# Patient Record
Sex: Female | Born: 1985 | Race: Asian | Hispanic: Yes | Marital: Married | Smoking: Former smoker
Health system: Southern US, Community
[De-identification: ages and names within clinical notes are randomized; demographics above are authoritative.]

## PROBLEM LIST (undated history)

## (undated) DIAGNOSIS — E7800 Pure hypercholesterolemia, unspecified: Secondary | ICD-10-CM

## (undated) DIAGNOSIS — F419 Anxiety disorder, unspecified: Secondary | ICD-10-CM

## (undated) DIAGNOSIS — N750 Cyst of Bartholin's gland: Secondary | ICD-10-CM

## (undated) DIAGNOSIS — A609 Anogenital herpesviral infection, unspecified: Secondary | ICD-10-CM

## (undated) DIAGNOSIS — R7989 Other specified abnormal findings of blood chemistry: Secondary | ICD-10-CM

## (undated) DIAGNOSIS — T7840XA Allergy, unspecified, initial encounter: Secondary | ICD-10-CM

## (undated) DIAGNOSIS — K5190 Ulcerative colitis, unspecified, without complications: Secondary | ICD-10-CM

## (undated) HISTORY — PX: CYSTECTOMY: SUR359

## (undated) HISTORY — DX: Cyst of Bartholin's gland: N75.0

## (undated) HISTORY — DX: Pure hypercholesterolemia, unspecified: E78.00

## (undated) HISTORY — DX: Anxiety disorder, unspecified: F41.9

## (undated) HISTORY — DX: Other specified abnormal findings of blood chemistry: R79.89

## (undated) HISTORY — DX: Allergy, unspecified, initial encounter: T78.40XA

## (undated) HISTORY — DX: Anogenital herpesviral infection, unspecified: A60.9

---
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
## 2019-03-27 ENCOUNTER — Other Ambulatory Visit: Payer: Self-pay

## 2019-03-27 ENCOUNTER — Ambulatory Visit (HOSPITAL_COMMUNITY)
Admission: EM | Admit: 2019-03-27 | Discharge: 2019-03-27 | Disposition: A | Discharge status: Home / Self Care | Payer: Self-pay | Attending: Family Medicine | Admitting: Family Medicine

## 2019-03-27 DIAGNOSIS — N898 Other specified noninflammatory disorders of vagina: Secondary | ICD-10-CM | POA: Insufficient documentation

## 2019-03-27 DIAGNOSIS — Z3202 Encounter for pregnancy test, result negative: Secondary | ICD-10-CM

## 2019-03-27 LAB — POCT PREGNANCY, URINE: Preg Test, Ur: NEGATIVE

## 2019-03-27 LAB — POCT URINALYSIS DIP (DEVICE)
Bilirubin Urine: NEGATIVE
Glucose, UA: NEGATIVE mg/dL
Ketones, ur: NEGATIVE mg/dL
Leukocytes,Ua: NEGATIVE
Nitrite: NEGATIVE
Protein, ur: NEGATIVE mg/dL
Specific Gravity, Urine: >=1.03 (ref 1.005–1.030)
Urobilinogen, UA: 0.2 mg/dL (ref 0.0–1.0)
pH: 6 (ref 5.0–8.0)

## 2019-03-27 NOTE — ED Provider Notes (Signed)
[place]    CSN: [id number] Arrival date & time: 03/27/19  1036     History   Chief Complaint Chief Complaint  Patient presents with  . Vaginal Discharge    HPI Rachel Hanson is a 33 y.o. female.   She presents today with 2-day history of white vaginal discharge.  She states her boyfriend has HSV-2; they have unprotected sex; she has been using new lubrication.  She denies blisters or lesions on her vulva; denies painful intercourse, fever, chills, itching, burning, dysuria. LMP: 28 days, on oral contraceptive.  The history is provided by the patient.    No past medical history on file.  There are no active problems to display for this patient.    OB History   No obstetric history on file.      Home Medications    Prior to Admission medications   Not on File    Family History No family history on file.  Social History Social History   Tobacco Use  . Smoking status: Not on file  Substance Use Topics  . Alcohol use: Not on file  . Drug use: Not on file     Allergies   Patient has no allergy information on record.   Review of Systems Review of Systems  Constitutional: Negative for chills and fever.  HENT: Negative for ear pain and sore throat.   Eyes: Negative for pain and visual disturbance.  Respiratory: Negative for cough and shortness of breath.   Cardiovascular: Negative for chest pain and palpitations.  Gastrointestinal: Negative for abdominal pain, diarrhea, nausea and vomiting.  Genitourinary: Positive for vaginal discharge. Negative for dyspareunia, dysuria, flank pain, hematuria, pelvic pain, urgency and vaginal pain.  Musculoskeletal: Negative for arthralgias and back pain.  Skin: Negative for color change and rash.  Neurological: Negative for seizures and syncope.  All other systems reviewed and are negative.    Physical Exam Triage Vital Signs ED Triage Vitals  Enc Vitals Group     BP 03/27/19 1119 109/79   Pulse Rate 03/27/19 1119 83     Resp 03/27/19 1119 16     Temp 03/27/19 1119 98.1 F (36.7 C)     Temp Source 03/27/19 1119 Oral     SpO2 03/27/19 1119 100 %     Weight --      Height --      Head Circumference --      Peak Flow --      Pain Score 03/27/19 1120 0     Pain Loc --      Pain Edu? --      Excl. in [place]? --    No data found.  Updated Vital Signs BP 109/79 (BP Location: Right Arm)   Pulse 83   Temp 98.1 F (36.7 C) (Oral)   Resp 16   LMP 03/27/2019   SpO2 100%   Visual Acuity Right Eye Distance:   Left Eye Distance:   Bilateral Distance:    Right Eye Near:   Left Eye Near:    Bilateral Near:     Physical Exam Vitals signs and nursing note reviewed.  Constitutional:      General: She is not in acute distress.    Appearance: She is well-developed.  HENT:     Head: Normocephalic and atraumatic.  Eyes:     Conjunctiva/sclera: Conjunctivae normal.  Neck:     Musculoskeletal: Neck supple.  Cardiovascular:     Rate and Rhythm: Normal rate  and regular rhythm.     Heart sounds: No murmur.  Pulmonary:     Effort: Pulmonary effort is normal. No respiratory distress.     Breath sounds: Normal breath sounds.  Abdominal:     Palpations: Abdomen is soft.     Tenderness: There is no abdominal tenderness. There is no right CVA tenderness, left CVA tenderness, guarding or rebound.  Genitourinary:    General: Normal vulva.     Exam position: Lithotomy position.     Pubic Area: No rash.      Labia:        Right: No rash, tenderness or lesion.        Left: No rash, tenderness or lesion.      Urethra: No urethral lesion.     Vagina: Vaginal discharge present. No tenderness or lesions.     Cervix: Normal.     Uterus: Normal.      Adnexa: Right adnexa normal and left adnexa normal.     Comments: Scant white and brown vaginal discharge. Lymphadenopathy:     Lower Body: No right inguinal adenopathy. No left inguinal adenopathy.  Skin:    General: Skin is warm and  dry.  Neurological:     Mental Status: She is alert.      UC Treatments / Results  Labs (all labs ordered are listed, but only abnormal results are displayed) Labs Reviewed  POCT URINALYSIS DIP (DEVICE) - Abnormal; Notable for the following components:      Result Value   Hgb urine dipstick MODERATE (*)    All other components within normal limits  POC URINE PREG, ED  POCT PREGNANCY, URINE  CERVICOVAGINAL ANCILLARY ONLY    EKG   Radiology No results found.  Procedures Procedures (including critical care time)  Medications Ordered in UC Medications - No data to display  Initial Impression / Assessment and Plan / UC Course  I have reviewed the triage vital signs and the nursing notes.  Pertinent labs & imaging results that were available during my care of the patient were reviewed by me and considered in my medical decision making (see chart for details).   Vaginal discharge.  UA negative, pregnancy test negative.  GC, chlamydia, trichomonas pending; low suspicion due to monogamous long-term relationship.  Discussed with patient the need to establish a GYN and follow-up if her symptoms persists.  Discussed that we will call her if additional treatment needed when other testing comes back.  Return here or go to the emergency room if she develops abdominal pain, dysuria, worsening vaginal discharge, fever, chills.   Final Clinical Impressions(s) / UC Diagnoses   Final diagnoses:  Vaginal discharge     Discharge Instructions     Your urine test was normal today.  Your pregnancy test was negative.  Other vaginal tests are pending and we will call you if you need treatment.   The vaginal discharge you are experiencing appears to be normal.  Please schedule follow-up with a local GYN that is the one listed.    ED Prescriptions    None     Controlled Substance Prescriptions [place] Controlled Substance Registry consulted? Not Applicable   Sharion Balloon, NP 03/27/19  1258

## 2019-03-27 NOTE — ED Triage Notes (Signed)
Per pt her partner has HSV2 and now having a creamy discharge. Pt also used new lube and was wondering if that was that. No blisters on her vagina, no symptoms at all

## 2019-03-27 NOTE — Discharge Instructions (Signed)
Your urine test was normal today.  Your pregnancy test was negative.  Other vaginal tests are pending and we will call you if you need treatment.   The vaginal discharge you are experiencing appears to be normal.  Please schedule follow-up with a local GYN that is the one listed.

## 2019-03-30 LAB — CERVICOVAGINAL ANCILLARY ONLY
Chlamydia: NEGATIVE
Neisseria Gonorrhea: NEGATIVE
Trichomonas: NEGATIVE

## 2019-06-21 ENCOUNTER — Emergency Department (HOSPITAL_COMMUNITY)
Admission: EM | Admit: 2019-06-21 | Discharge: 2019-06-21 | Disposition: A | Discharge status: Home / Self Care | Payer: Medicaid Other | Attending: Emergency Medicine | Admitting: Emergency Medicine

## 2019-06-21 ENCOUNTER — Encounter (HOSPITAL_COMMUNITY): Payer: Self-pay | Admitting: Emergency Medicine

## 2019-06-21 ENCOUNTER — Other Ambulatory Visit: Payer: Self-pay

## 2019-06-21 DIAGNOSIS — N751 Abscess of Bartholin's gland: Secondary | ICD-10-CM | POA: Diagnosis not present

## 2019-06-21 DIAGNOSIS — R102 Pelvic and perineal pain: Secondary | ICD-10-CM | POA: Diagnosis present

## 2019-06-21 DIAGNOSIS — Z23 Encounter for immunization: Secondary | ICD-10-CM | POA: Insufficient documentation

## 2019-06-21 MED ORDER — SULFAMETHOXAZOLE-TRIMETHOPRIM 800-160 MG PO TABS: 1.0000 | ORAL_TABLET | Freq: Two times a day (BID) | ORAL | 0 refills | Status: AC

## 2019-06-21 MED ORDER — LIDOCAINE HCL (PF) 1 % IJ SOLN
5.0000 mL | Freq: Once | INTRAMUSCULAR | Status: AC
  Administered 2019-06-21: 09:00:00 5 mL
  Filled 2019-06-21: qty 5

## 2019-06-21 MED ORDER — MORPHINE SULFATE (PF) 4 MG/ML IV SOLN
4.0000 mg | Freq: Once | INTRAVENOUS | Status: AC
  Administered 2019-06-21: 4 mg via INTRAMUSCULAR
  Filled 2019-06-21: qty 1

## 2019-06-21 MED ORDER — TETANUS-DIPHTH-ACELL PERTUSSIS 5-2.5-18.5 LF-MCG/0.5 IM SUSP
0.5000 mL | Freq: Once | INTRAMUSCULAR | Status: AC
  Administered 2019-06-21: 0.5 mL via INTRAMUSCULAR
  Filled 2019-06-21: qty 0.5

## 2019-06-21 NOTE — ED Triage Notes (Signed)
Pt c/o abscess to left labia, has tried warm compresses at home with no relief.

## 2019-06-21 NOTE — Discharge Instructions (Signed)
Make sure to soak in warm sitz baths 3-4 times daily. Take the antibiotics as prescribed. May take Tylenol as needed for pain. Follow up with the women's outpatient clinic in 2 days for a wound recheck.  Return for new or worsening symptoms such as fever, increased well, redness, warmth, vomiting.

## 2019-06-21 NOTE — ED Provider Notes (Signed)
[place] EMERGENCY DEPARTMENT Provider Note   CSN: [id number] Arrival date & time: 06/21/19  0093    History   Chief Complaint Chief Complaint  Patient presents with  . Abscess   HPI Rachel Hanson is a 33 y.o. female with past medical history significant for Bartholin's abscess who presents for evaluation of labial swelling.  Patient states 3 days ago she noticed some left labial swelling.  Started with sitz bath and warm compresses at home as well as Tylenol.  Patient states she woke up in the middle the night with worsening pain to her left labia.  She is unable to sit secondary to pain.  Denies fever, chills, nausea, vomiting, chest pain, shortness of breath, abdominal pain, dysuria, vaginal discharge, concerns for STDs.  Rates current pain a 10/10.  No additional aggravating or alleviating factors.  She is urinating without difficulty. States sx consistent with prior bartholin abscess  History obtained from patient and past medical records.  No interpreter is used.     HPI  History reviewed. No pertinent past medical history.  There are no active problems to display for this patient.   History reviewed. No pertinent surgical history.   OB History   No obstetric history on file.      Home Medications    Prior to Admission medications   Medication Sig Start Date End Date Taking? Authorizing Provider  sulfamethoxazole-trimethoprim (BACTRIM DS) 800-160 MG tablet Take 1 tablet by mouth 2 (two) times daily for 7 days. 06/21/19 06/28/19  ,  A, PA-C    Family History No family history on file.  Social History Social History   Tobacco Use  . Smoking status: Never Smoker  . Smokeless tobacco: Never Used  Substance Use Topics  . Alcohol use: Yes  . Drug use: Never     Allergies   Patient has no known allergies.   Review of Systems Review of Systems  Constitutional: Negative.   HENT: Negative.   Respiratory: Negative.    Cardiovascular: Negative.   Genitourinary: Positive for vaginal pain. Negative for decreased urine volume, difficulty urinating, dysuria, frequency, genital sores, menstrual problem, pelvic pain, urgency, vaginal bleeding and vaginal discharge.  Musculoskeletal: Negative.   Skin: Negative.   Neurological: Negative.   All other systems reviewed and are negative.    Physical Exam Updated Vital Signs BP 104/84 (BP Location: Left Arm)   Pulse (!) 121   Temp 99.4 F (37.4 C) (Oral)   Resp 18   SpO2 100%   Physical Exam Vitals signs and nursing note reviewed. Exam conducted with a chaperone present.  Constitutional:      General: She is not in acute distress.    Appearance: She is well-developed. She is not ill-appearing, toxic-appearing or diaphoretic.  HENT:     Head: Normocephalic and atraumatic.     Nose: Nose normal.     Mouth/Throat:     Mouth: Mucous membranes are moist.     Pharynx: Oropharynx is clear.  Eyes:     Pupils: Pupils are equal, round, and reactive to light.  Neck:     Musculoskeletal: Normal range of motion.  Cardiovascular:     Rate and Rhythm: Normal rate.     Pulses: Normal pulses.     Heart sounds: Normal heart sounds.     Comments: HR 92 Pulmonary:     Effort: Pulmonary effort is normal. No respiratory distress.     Breath sounds: Normal breath sounds.  Abdominal:  General: Bowel sounds are normal. There is no distension.     Tenderness: There is no abdominal tenderness. There is no right CVA tenderness, left CVA tenderness, guarding or rebound.     Hernia: There is no hernia in the left inguinal area or right inguinal area.  Genitourinary:    Labia:        Right: No rash, tenderness, lesion or injury.        Left: Tenderness present. No rash, lesion or injury.      Urethra: No prolapse, urethral pain, urethral swelling or urethral lesion.       Comments: Left labial swelling, tenderness to palpation with fluctuance. Musculoskeletal: Normal  range of motion.     Comments: Moves all 4 extremities without difficulty.  Lymphadenopathy:     Lower Body: No right inguinal adenopathy. No left inguinal adenopathy.  Skin:    General: Skin is warm and dry.     Comments: Mild surrounding erythema to left labial area of fluctuance.  Neurological:     Mental Status: She is alert.    ED Treatments / Results  Labs (all labs ordered are listed, but only abnormal results are displayed) Labs Reviewed - No data to display  EKG None  Radiology No results found.  Procedures .Marland KitchenIncision and Drainage  Date/Time: 06/21/2019 9:00 AM Performed by: Nettie Elm, PA-C Authorized by: Nettie Elm, PA-C   Consent:    Consent obtained:  Verbal   Consent given by:  Patient   Risks discussed:  Bleeding, incomplete drainage, pain and damage to other organs   Alternatives discussed:  No treatment Universal protocol:    Procedure explained and questions answered to patient or proxy's satisfaction: yes     Relevant documents present and verified: yes     Test results available and properly labeled: yes     Imaging studies available: yes     Required blood products, implants, devices, and special equipment available: yes     Site/side marked: yes     Immediately prior to procedure a time out was called: yes     Patient identity confirmed:  Verbally with patient and arm band Location:    Type:  Abscess   Location:  Anogenital   Anogenital location:  Bartholin's gland Pre-procedure details:    Skin preparation:  Betadine Sedation:    Sedation type:  Anxiolysis Anesthesia (see MAR for exact dosages):    Anesthesia method:  Local infiltration   Local anesthetic:  Lidocaine 1% w/o epi Procedure type:    Complexity:  Complex Procedure details:    Incision types:  Single straight   Incision depth:  Subcutaneous   Scalpel blade:  11   Wound management:  Probed and deloculated, irrigated with saline and extensive cleaning    Drainage:  Purulent   Drainage amount:  Copious   Wound treatment:  Drain placed   Packing materials:  Word catheter Post-procedure details:    Patient tolerance of procedure:  Tolerated well, no immediate complications   (including critical care time)  Medications Ordered in ED Medications  lidocaine (PF) (XYLOCAINE) 1 % injection 5 mL (has no administration in time range)  morphine 4 MG/ML injection 4 mg (4 mg Intramuscular Given 06/21/19 0815)  Tdap (BOOSTRIX) injection 0.5 mL (0.5 mLs Intramuscular Given 06/21/19 0815)    Initial Impression / Assessment and Plan / ED Course  I have reviewed the triage vital signs and the nursing notes.  Pertinent labs & imaging results that were  available during my care of the patient were reviewed by me and considered in my medical decision making (see chart for details).  33 year old female appears otherwise well presents for evaluation of labial swelling.  Symptoms consistent with prior Bartholin's abscess.  She is afebrile, nonseptic, non-ill-appearing.  Initial triage heart rate of 121 however her rate on my exam in the low 90s.  Appears overall well.  Abdomen soft, nontender without rebound or guarding.  No recent sexual activity, no concerns for STDs.  Patient with mild surrounding erythema, fluctuance and tenderness to left labia.  Unknown last tetanus.  Will update. Plan I&D.  Patient tolerated procedure well. See procedure note. Copious purulent, malodorous drainage. Given erythema surrounding fluctuance will cover with ABX. Patient to follow up with ObGYn in 2 days for wound recheck. Encouraged warm soak and sitz baths. Patient to return for new or worsening symptoms.  The patient has been appropriately medically screened and/or stabilized in the ED. I have low suspicion for any other emergent medical condition which would require further screening, evaluation or treatment in the ED or require inpatient management.      Final Clinical  Impressions(s) / ED Diagnoses   Final diagnoses:  Abscess of Bartholin's gland    ED Discharge Orders         Ordered    sulfamethoxazole-trimethoprim (BACTRIM DS) 800-160 MG tablet  2 times daily     06/21/19 0903           ,  A, PA-C 06/21/19 0905    Isla Pence, MD 06/21/19 [phone]

## 2019-06-21 NOTE — ED Notes (Signed)
Large amt. Swelling to left side of her labia, states it started 2 days ago and has gotten worse. Very large and swollen , States she has had a Bartholin cyst before

## 2019-06-21 NOTE — ED Notes (Signed)
Pt verbalized understanding of discharge paperwork, prescriptions and follow-up care

## 2019-06-22 ENCOUNTER — Ambulatory Visit: Payer: Self-pay | Admitting: Obstetrics & Gynecology

## 2019-06-23 ENCOUNTER — Other Ambulatory Visit: Payer: Self-pay

## 2019-06-23 ENCOUNTER — Encounter: Payer: Self-pay | Admitting: Advanced Practice Midwife

## 2019-06-23 ENCOUNTER — Ambulatory Visit (INDEPENDENT_AMBULATORY_CARE_PROVIDER_SITE_OTHER): Payer: Self-pay | Admitting: Advanced Practice Midwife

## 2019-06-23 VITALS — BP 101/66 | HR 118 | Resp 16 | Ht 62.0 in | Wt 118.0 lb

## 2019-06-23 DIAGNOSIS — N898 Other specified noninflammatory disorders of vagina: Secondary | ICD-10-CM

## 2019-06-23 DIAGNOSIS — N760 Acute vaginitis: Secondary | ICD-10-CM | POA: Diagnosis not present

## 2019-06-23 DIAGNOSIS — N750 Cyst of Bartholin's gland: Secondary | ICD-10-CM

## 2019-06-23 DIAGNOSIS — B9689 Other specified bacterial agents as the cause of diseases classified elsewhere: Secondary | ICD-10-CM | POA: Diagnosis not present

## 2019-06-23 DIAGNOSIS — T7840XA Allergy, unspecified, initial encounter: Secondary | ICD-10-CM

## 2019-06-23 MED ORDER — ACETAMINOPHEN 325 MG PO TABS: 650.0000 mg | ORAL_TABLET | Freq: Four times a day (QID) | ORAL | 3 refills | Status: DC | PRN

## 2019-06-23 MED ORDER — AMOXICILLIN-POT CLAVULANATE 875-125 MG PO TABS: 1.0000 | ORAL_TABLET | Freq: Two times a day (BID) | ORAL | 1 refills | Status: DC

## 2019-06-23 NOTE — Progress Notes (Signed)
GYNECOLOGY PROBLEM VISIT NOTE  History:     Rachel Hanson is a 33 y.o. G0P0000 female here for follow-up to incision and drainage of left Bartholin Gland cyst at [facility] on 05/26/19 .  Current complaints: multiple complaints including elevated oral temp of 100.0, facial swelling, red and irritated eyes, swollen hands, muscle pain and generally feeling unwell. She states onset of symptoms coincides with initiation of Bactrim following I&D of her Bartholin cyst. She endorses a similar reaction when she took Bactrim for a UTI a few years ago. Denies abnormal vaginal bleeding, discharge, pelvic pain, problems with intercourse or other gynecologic concerns.    Gynecologic History Patient's last menstrual period was 06/10/2019.  Obstetric History OB History  Gravida Para Term Preterm AB Living  0 0 0 0 0 0  SAB TAB Ectopic Multiple Live Births  0 0 0 0 0    Past Medical History:  Diagnosis Date  . Ulcerative colitis ([place])     History reviewed. No pertinent surgical history.  Current Outpatient Medications on File Prior to Visit  Medication Sig Dispense Refill  . sulfamethoxazole-trimethoprim (BACTRIM DS) 800-160 MG tablet Take 1 tablet by mouth 2 (two) times daily for 7 days. 14 tablet 0   No current facility-administered medications on file prior to visit.     No Known Allergies  Social History:  reports that she has been smoking cigarettes. She has never used smokeless tobacco. She reports current alcohol use. She reports that she does not use drugs.  History reviewed. No pertinent family history.  The following portions of the patient's history were reviewed and updated as appropriate: allergies, current medications, past family history, past medical history, past social history, past surgical history and problem list.  Review of Systems Pertinent items noted in HPI and remainder of comprehensive ROS otherwise negative.  Physical Exam:  BP 101/66   Pulse (!) 118   Resp  16   Ht 5' 2"  (1.575 m)   Wt 118 lb (53.5 kg)   LMP 06/10/2019   BMI 21.58 kg/m  CONSTITUTIONAL: Well-developed, well-nourished female in no acute distress.  HENT:  Normocephalic, atraumatic, External right and left ear normal. Oropharynx is clear and moist EYES: Conjunctivae and EOM are normal. Pupils are equal, round, and reactive to light. No scleral icterus.  NECK: Normal range of motion, supple, no masses.  Normal thyroid.  SKIN: Skin is warm and dry. No rash noted. Not diaphoretic. No erythema. No pallor. MUSCULOSKELETAL: Normal range of motion. No tenderness.  No cyanosis, clubbing, or edema.  2+ distal pulses. NEUROLOGIC: Alert and oriented to person, place, and time. Normal reflexes, muscle tone coordination. No cranial nerve deficit noted. PSYCHIATRIC: Normal mood and affect. Normal behavior. Normal judgment and thought content. CARDIOVASCULAR: Normal heart rate noted, regular rhythm RESPIRATORY: Clear to auscultation bilaterally. Effort and breath sounds normal, no problems with respiration noted. BREASTS: Symmetric in size. No masses, skin changes, nipple drainage, or lymphadenopathy. ABDOMEN: Soft, normal bowel sounds, no distention noted.  No tenderness, rebound or guarding.  PELVIC: Normal appearing external genitalia; normal appearing vaginal mucosa and cervix.  Thick white discharge visible throughout vaginal vault. Left labia slightly swollen, Word catheter in place   Assessment and Plan:    1. Bartholin gland cyst - Healing well, updated patient on recommendation to leave Word catheter in place for 4-6 weeks   2. Vaginal discharge - Cervicovaginal ancillary only( [place])  3. Allergic reaction, initial encounter - Encouraged patient to d/c Bactrim  due to consistent sensitivity if not true allergy - Replaced with Augmentin - Patient encouraged to report to ED if current complaints become more acute  - Advised that due to recent ED and possible exposure, she may  not be eligible for clinic appointment if she has true fever   Routine preventative health maintenance measures emphasized. Please refer to After Visit Summary for other counseling recommendations.      Mallie Snooks, MSN, CNM Certified Nurse Midwife, [facility] for [employer], [place] 06/23/19 10:56 AM

## 2019-06-23 NOTE — Patient Instructions (Signed)
Bartholin's Cyst  A Bartholin's cyst is a fluid-filled sac that forms on a Bartholin's gland. Bartholin's glands are small glands in the folds of skin around the opening of the vagina (labia). This type of cyst causes a bulge or lump near the lower opening of the vagina. If you have a cyst that is small and not infected, you may be able to take care of it at home. If your cyst gets infected, it may cause pain and your doctor may need to drain it. An infected Bartholin's cyst is called a Bartholin's abscess. Follow these instructions at home: Medicines  Take over-the-counter and prescription medicines only as told by your doctor.  If you were prescribed an antibiotic medicine, take it as told by your doctor. Do not stop taking the antibiotic even if you start to feel better. Managing pain and swelling  Try sitz baths to help with pain and swelling. A sitz bath is a warm water bath in which the water only comes up to your hips and should cover your buttocks. You may take sitz baths a few times a day.  Put heat on the affected area as often as needed. Use the heat source that your doctor recommends, such as a moist heat pack or a heating pad. ? Place a towel between your skin and the heat source. ? Leave the heat on for 20-30 minutes. ? Remove the heat if your skin turns bright red. This is especially important if you cannot feel pain, heat, or cold. You may have a greater risk of getting burned. General instructions  If your cyst or abscess was drained: ? Follow instructions from your doctor about how to take care of your wound. ? Use feminine pads to absorb any fluid.  Do not push on or squeeze your cyst.  Do not have sex until the cyst has gone away or your wound from drainage has healed.  Take these steps to help prevent a Bartholin's cyst from returning, and to prevent other Bartholin's cysts from forming: ? Take a bath or shower once a day. Clean your vaginal area with mild soap and  water when you bathe. ? Practice safe sex to prevent STIs (sexually transmitted infections). Talk with your doctor about how to prevent STIs and which forms of birth control (contraception) may be best for you.  Keep all follow-up visits as told by your doctor. This is important. Contact a doctor if:  You have a fever.  You get redness, swelling, or pain around your cyst.  You have fluid, blood, pus, or a bad smell coming from your cyst.  You have a cyst that gets larger or comes back. Summary  A Bartholin's cyst is a fluid-filled sac that forms on a Bartholin's gland. These small glands are found in the folds of skin around the opening of the vagina (labia).  This type of cyst causes a bulge or lump near the lower opening of the vagina. An infected Bartholin's cyst is called a Bartholin's abscess.  Try sitz baths a few times a day to help with pain and swelling.  Do not push on or squeeze your cyst. This information is not intended to replace advice given to you by your health care provider. Make sure you discuss any questions you have with your health care provider. Document Released: 12/06/2008 Document Revised: 07/02/2018 Document Reviewed: 06/11/2017 Elsevier Patient Education  2020 [employer].

## 2019-06-24 ENCOUNTER — Other Ambulatory Visit: Payer: Self-pay | Admitting: Advanced Practice Midwife

## 2019-06-24 DIAGNOSIS — B9689 Other specified bacterial agents as the cause of diseases classified elsewhere: Secondary | ICD-10-CM

## 2019-06-24 LAB — CERVICOVAGINAL ANCILLARY ONLY
Bacterial Vaginitis (gardnerella): POSITIVE — AB
Candida Glabrata: NEGATIVE
Candida Vaginitis: NEGATIVE
Molecular Disclaimer: NEGATIVE
Molecular Disclaimer: NEGATIVE
Molecular Disclaimer: NORMAL

## 2019-06-24 MED ORDER — METRONIDAZOLE 500 MG PO TABS: 500.0000 mg | ORAL_TABLET | Freq: Two times a day (BID) | ORAL | 0 refills | Status: DC

## 2019-06-24 NOTE — Progress Notes (Signed)
Oh ok.

## 2019-06-24 NOTE — Progress Notes (Signed)
Sure. She wanted me to let you know they change her antibiotic yesterday.

## 2019-06-24 NOTE — Progress Notes (Signed)
+   BV, significant thin whitish discharge with fishy smell on exam yesterday. Clinic asked to reach out to patient to notify her of rx.  Mallie Snooks, MSN, CNM Certified Nurse Midwife, [employer] for [employer], [place] [facility] 06/24/19 12:41 PM

## 2019-07-14 ENCOUNTER — Other Ambulatory Visit: Payer: Self-pay

## 2019-07-14 ENCOUNTER — Ambulatory Visit (INDEPENDENT_AMBULATORY_CARE_PROVIDER_SITE_OTHER): Payer: Medicaid Other | Admitting: Family Medicine

## 2019-07-14 ENCOUNTER — Encounter: Payer: Self-pay | Admitting: Family Medicine

## 2019-07-14 VITALS — BP 100/67 | HR 72 | Wt 114.2 lb

## 2019-07-14 DIAGNOSIS — Z3169 Encounter for other general counseling and advice on procreation: Secondary | ICD-10-CM | POA: Diagnosis not present

## 2019-07-14 DIAGNOSIS — Z308 Encounter for other contraceptive management: Secondary | ICD-10-CM | POA: Diagnosis not present

## 2019-07-14 DIAGNOSIS — N750 Cyst of Bartholin's gland: Secondary | ICD-10-CM | POA: Diagnosis not present

## 2019-07-14 NOTE — Progress Notes (Signed)
  GYNECOLOGY PROBLEM PROGRESS NOTE  History:  33 y.o. G0P0000 presents to [facility] office today for problem gyn visit following lancing of bartholin gland cyst, BV treatment, and concerns for fertility moving forward. She reports improved pain, tenderness, swelling, erythema, and discharge. She reports that her catheter that was placed after cyst lancing fell out ~9 days ago (10/12). She reports that she experiences some tenderness and swelling around her bartholin glands after intercourse but this improves within a few hours after intercourse. She denies any fever, chills, or pain with penetration.  She took her BV abx as prescribed and denies any vaginal discharge, discomfort, itchiness, or irritation.  She reports that she stopped taking OCPs ~1 month ago because she is a current smoker and read that it increases her risk for DVT/PE. She and her partner have been using condoms for [facility] since this time. She states that they are not actively avoiding pregnancy but are also not actively trying. She reports SE such as anxiety, palpitations, and "not feeling normal" with Depo shot prior to transitioning to OCPs. She is thinking she and her partner may actively try to get pregnancy in the next 1-2 years. She is concerned about her fertility due to her age.  She denies h/a, dizziness, shortness of breath, n/v.    The following portions of the patient's history were reviewed and updated as appropriate: allergies, current medications, past family history, past medical history, past social history, past surgical history and problem list.   Review of Systems:  Pertinent items are noted in HPI.   Objective:  Physical Exam Blood pressure 100/67, pulse 72, weight 114 lb 3.2 oz (51.8 kg), last menstrual period 06/17/2019. VS reviewed, nursing note reviewed,  Constitutional: well developed, well nourished, no distress HEENT: normocephalic CV: normal rate Pulm/chest wall: normal effort Neuro: alert and  oriented x 3 Skin: warm, dry Psych: affect normal, somewhat anxious about bartholin cyst and future fertility GU Exam: normal external genitalia, small scar from catheter insertion site on left vulva, no erythema, swelling, induration, or increased warmth  Assessment & Plan:  1. Bartholin Gland Cyst F/u, Vulvar swelling after intercourse Healing appropriately, no concerns for recurrence at this time. Discussed lack of proven preventative techniques but encouraged warm compress/shower/sits bath after intercourse to encourage drainage. Also encouraged introitus stretching, stimulation, and lubrication during intercourse to decrease irritation/swelling after intercourse.  2. Bacterial Vaginosis Completed course of Abx. No complaints concerning for persistent BV.   3. Family Planning Extensively discussed family planning options and fertility with [place] including [place], IUD, Nuvaring, Depo Shot, and natural family planning. Did not recommend Depo shot due to fertility shadow effect. Shaquaya decided that natural family planning was the best option for her and her partner at this time because they are not actively avoiding pregnancy and hope to intentionally try for pregnancy soon. We then discussed natural family planning techniques such as period tracking, Am temperature checks, and cervical mucus checks. Encouraged how we would not be concerned about her fertility until at least 6 months of active efforts to become pregnant. -Follow-up in 3 months or as needed for concerns r/e family planning and fertility  Kathryne Eriksson, Medical Student 11:59 AM

## 2019-08-25 ENCOUNTER — Encounter: Payer: Self-pay | Admitting: Family Medicine

## 2019-08-26 LAB — OB RESULTS CONSOLE ABO/RH: RH Type: POSITIVE

## 2019-08-26 LAB — OB RESULTS CONSOLE ANTIBODY SCREEN: Antibody Screen: NEGATIVE

## 2019-08-26 LAB — OB RESULTS CONSOLE RPR: RPR: NONREACTIVE

## 2019-08-26 LAB — OB RESULTS CONSOLE GC/CHLAMYDIA
Chlamydia: NEGATIVE
Gonorrhea: NEGATIVE

## 2019-08-26 LAB — OB RESULTS CONSOLE RUBELLA ANTIBODY, IGM: Rubella: IMMUNE

## 2019-08-26 LAB — OB RESULTS CONSOLE HIV ANTIBODY (ROUTINE TESTING): HIV: NONREACTIVE

## 2019-08-26 LAB — OB RESULTS CONSOLE HEPATITIS B SURFACE ANTIGEN: Hepatitis B Surface Ag: NEGATIVE

## 2019-11-23 ENCOUNTER — Other Ambulatory Visit (INDEPENDENT_AMBULATORY_CARE_PROVIDER_SITE_OTHER): Payer: Medicaid Other

## 2019-11-23 ENCOUNTER — Encounter: Payer: Self-pay | Admitting: Gastroenterology

## 2019-11-23 ENCOUNTER — Telehealth: Payer: Self-pay | Admitting: Emergency Medicine

## 2019-11-23 ENCOUNTER — Other Ambulatory Visit: Payer: Self-pay

## 2019-11-23 ENCOUNTER — Ambulatory Visit: Payer: Medicaid Other | Admitting: Gastroenterology

## 2019-11-23 VITALS — BP 98/48 | HR 99 | Temp 98.7°F | Ht 62.0 in | Wt 124.0 lb

## 2019-11-23 DIAGNOSIS — Z8719 Personal history of other diseases of the digestive system: Secondary | ICD-10-CM | POA: Diagnosis not present

## 2019-11-23 DIAGNOSIS — O0001 Abdominal pregnancy with intrauterine pregnancy: Secondary | ICD-10-CM | POA: Diagnosis not present

## 2019-11-23 DIAGNOSIS — K602 Anal fissure, unspecified: Secondary | ICD-10-CM

## 2019-11-23 LAB — SEDIMENTATION RATE: Sed Rate: 11 mm/hr (ref 0–20)

## 2019-11-23 LAB — C-REACTIVE PROTEIN: CRP: <1 mg/dL (ref 0.5–20.0)

## 2019-11-23 NOTE — Progress Notes (Signed)
Photograph of posterior anal fissure note on physical exam during consultation today.

## 2019-11-23 NOTE — Patient Instructions (Addendum)
Your provider has requested that you go to the basement level for lab work before leaving today. Press "B" on the elevator. The lab is located at the first door on the left as you exit the elevator.  Sitz baths two to three times daily for pain relief. See the information below about how to do a sitz bath.  High fiber diet - increasing both dietary fiber (35 grams daily) and water intake   Citrucel or Metamucil daily, add Miralax 17 g daily if needed to keep stools soft  Nitroglycerin 0.125% gel applied to the rectum three times a day x 6-8 weeks   Return in 6-8 weeks if symptoms are not improving    How to Take a [employer] A sitz bath is a warm water bath that may be used to care for your rectum, genital area, or the area between your rectum and genitals (perineum). For a sitz bath, the water only comes up to your hips and covers your buttocks. A sitz bath may done at home in a bathtub or with a portable sitz bath that fits over the toilet. Your health care provider may recommend a sitz bath to help:  Relieve pain and discomfort after delivering a baby.  Relieve pain and itching from hemorrhoids or anal fissures.  Relieve pain after certain surgeries.  Relax muscles that are sore or tight. How to take a sitz bath Take 3-4 sitz baths a day, or as many as told by your health care provider. Bathtub sitz bath To take a sitz bath in a bathtub: 1. Partially fill a bathtub with warm water. The water should be deep enough to cover your hips and buttocks when you are sitting in the tub. 2. If your health care provider told you to put medicine in the water, follow his or her instructions. 3. Sit in the water. 4. Open the tub drain a little, and leave it open during your bath. 5. Turn on the warm water again, enough to replace the water that is draining out. Keep the water running throughout your bath. This helps keep the water at the right level and the right temperature. 6. Soak in the water  for 15-20 minutes, or as long as told by your health care provider. 7. When you are done, be careful when you stand up. You may feel dizzy. 8. After the sitz bath, pat yourself dry. Do not rub your skin to dry it.  Over-the-toilet sitz bath To take a sitz bath with an over-the-toilet basin: 1. Follow the manufacturer's instructions. 2. Fill the basin with warm water. 3. If your health care provider told you to put medicine in the water, follow his or her instructions. 4. Sit on the seat. Make sure the water covers your buttocks and perineum. 5. Soak in the water for 15-20 minutes, or as long as told by your health care provider. 6. After the sitz bath, pat yourself dry. Do not rub your skin to dry it. 7. Clean and dry the basin between uses. 8. Discard the basin if it cracks, or according to the manufacturer's instructions. Contact a health care provider if:  Your symptoms get worse. Do not continue with sitz baths if your symptoms get worse.  You have new symptoms. If this happens, do not continue with sitz baths until you talk with your health care provider. Summary  A sitz bath is a warm water bath in which the water only comes up to your hips and covers your  buttocks.  A sitz bath may help relieve itching, relieve pain, and relax muscles that are sore or tight in the lower part of your body, including your genital area.  Take 3-4 sitz baths a day, or as many as told by your health care provider. Soak in the water for 15-20 minutes.  Do not continue with sitz baths if your symptoms get worse. This information is not intended to replace advice given to you by your health care provider. Make sure you discuss any questions you have with your health care provider. Document Revised: 02/08/2019 Document Reviewed: 09/11/2017 Elsevier Patient Education  [place].

## 2019-11-23 NOTE — Progress Notes (Signed)
Referring Provider: Deliah Boston, MD Primary Care Physician:  Patient, No Pcp Per  Reason for Consultation: Ulcerative colitis, pregnancy  IMPRESSION:  Rectal bleeding due to posterior anal fissure History of ulcerative colitis    - diagnosed in 10th grade on colonoscopy in [place]    - initially treated with sulfasalazine x 1 year    - no GI follow-up since that time IUP pregnancy at 23 weeks  Anterior anal fissure seen on rectal exam. Given the concurrent features, must exclude concurrent IBD, although this would usually be due to Crohn's instead of ulcerative colitis. Will screen for active IBD with serum and stool markers of inflammation. Would like to avoid endoscopy if at all possible during pregnancy.      PLAN: ESR, CRP, and fecal calprotectin Sitz baths two to three times daily for pain relief High fiber diet - increasing both dietary fiber (35 grams daily) and water intake  Citrucel or Metamucil daily, add Miralax 17 g daily if needed to keep stools soft Nitroglycerin 0.125% gel applied to the rectum TID x 6-8 weeks if approved by Dr. Wilhelmenia Blase Return in 6-8 weeks if symptoms are not improving   Please see the "Patient Instructions" section for addition details about the plan.  HPI: Rachel Hanson is a 34 y.o. female referred by Dr. Wilhelmenia Blase she for further evaluation of ulcerative colitis.  The history is obtained through the patient and review of her electronic health record as well as records provided by Dr. Wilhelmenia Blase.  She is currently [redacted] weeks pregnant. Pregnancy is going well.  She has a history of a Bartholin's abscess and anogenital herpes. Just moved to [place] from [place]. Previously worked at [employer]. Moved to be closer to her boyfriend.   Has a history of ulcerative colitis initially diagnosed in high school in [place], [place] when she presented with abdominal pain and bleeding.  Had a colonoscopy in 10th grade. Took  medications for one year - thinks it was sulfasalazine. Has not been on medications since then.  Has not seen a gastroenterologist for years.  Occasional sees blood in the stool. Attributes that to alcohol.  Has seen blood once in the first trimester and one in the second trimester. This has her concerned as she knows that colitis can flare during pregnancy.   No abdominal pain.   Baseline bowel habits are 3 formed BM daily. Since pregnancy, now having 1-2 BM daily. Some rectal pain with defecation.   Labs from 08/26/2019 show a hemoglobin of 13.2, platelets 297, HBsAg negative, HIV negative  Adopted. Biologic medical history is unknown.   Flu vaccine most years. No prior Pneumovax. Thinks she was vaccinated for HAV and HBV. Has not Covid vaccine.      Past Medical History:  Diagnosis Date  . Ulcerative colitis ([place])     No past surgical history on file.  Current Outpatient Medications  Medication Sig Dispense Refill  . Prenatal Vit-Fe Fumarate-FA (MULTIVITAMIN-PRENATAL) 27-0.8 MG TABS tablet Take 1 tablet by mouth daily at 12 noon.    Marland Kitchen acetaminophen (TYLENOL) 325 MG tablet Take 2 tablets (650 mg total) by mouth every 6 (six) hours as needed for moderate pain or fever. (Patient not taking: Reported on 07/14/2019) 30 tablet 3  . JUNEL FE 1/20 1-20 MG-MCG tablet Take 1 tablet by mouth daily.     No current facility-administered medications for this visit.    Allergies as of 11/23/2019 - Review Complete 07/14/2019  Allergen  Reaction Noted  . Bactrim [sulfamethoxazole-trimethoprim]  06/23/2019    No family history on file.  Social History   Socioeconomic History  . Marital status: Single    Spouse name: Not on file  . Number of children: Not on file  . Years of education: Not on file  . Highest education level: Not on file  Occupational History  . Not on file  Tobacco Use  . Smoking status: Current Every Day Smoker    Types: Cigarettes  . Smokeless tobacco: Never  Used  Substance and Sexual Activity  . Alcohol use: Yes  . Drug use: Never  . Sexual activity: Yes    Birth control/protection: None  Other Topics Concern  . Not on file  Social History Narrative  . Not on file   Social Determinants of Health   Financial Resource Strain:   . Difficulty of Paying Living Expenses: Not on file  Food Insecurity:   . Worried About Charity fundraiser in the Last Year: Not on file  . Ran Out of Food in the Last Year: Not on file  Transportation Needs:   . Lack of Transportation (Medical): Not on file  . Lack of Transportation (Non-Medical): Not on file  Physical Activity:   . Days of Exercise per Week: Not on file  . Minutes of Exercise per Session: Not on file  Stress:   . Feeling of Stress : Not on file  Social Connections:   . Frequency of Communication with Friends and Family: Not on file  . Frequency of Social Gatherings with Friends and Family: Not on file  . Attends Religious Services: Not on file  . Active Member of Clubs or Organizations: Not on file  . Attends Archivist Meetings: Not on file  . Marital Status: Not on file  Intimate Partner Violence:   . Fear of Current or Ex-Partner: Not on file  . Emotionally Abused: Not on file  . Physically Abused: Not on file  . Sexually Abused: Not on file    Review of Systems: 12 system ROS is negative except as noted above. No extra-GI manifestations of IBD.   Physical Exam: General:   Alert,  well-nourished, pleasant and cooperative in NAD Head:  Normocephalic and atraumatic. Eyes:  Sclera clear, no icterus.   Conjunctiva pink. Ears:  Normal auditory acuity. Nose:  No deformity, discharge,  or lesions. Mouth:  No deformity or lesions.   Neck:  Supple; no masses or thyromegaly. Lungs:  Clear throughout to auscultation.   No wheezes. Heart:  Regular rate and rhythm; no murmurs. Abdomen:  Soft, Gravid, nontender, nondistended, normal bowel sounds, no rebound or guarding. No  hepatosplenomegaly.   Rectal:   No chemical dermatitis. Posterior anal fissure (see photograph captured during exam today). No external hemorrhoids or fistula. No prolapsing hemorrhoids. No rectal prolapse. Normal anocutaneous reflex. No stool in the rectal vault. No mass or fecal impaction. Normal anal resting tone. Chaperone: Desiree.  Msk:  Symmetrical. No boney deformities LAD: No inguinal or umbilical LAD Extremities:  No clubbing or edema. Neurologic:  Alert and  oriented x4;  grossly nonfocal Skin: No rash or bruise. Psych:  Alert and cooperative. Normal mood and affect.     Ontario Pettengill L. Tarri Glenn, MD, MPH 11/23/2019, 1:40 PM

## 2019-11-24 MED ORDER — AMBULATORY NON FORMULARY MEDICATION: 1 refills | Status: DC

## 2019-11-24 NOTE — Telephone Encounter (Signed)
Called [place] and left message to see if patient is ok to take Nitroglycerin gel.

## 2019-11-24 NOTE — Telephone Encounter (Signed)
Spoke with Rachel Hanson at Dr Ivor Costa office ok for patient to take Nitroglycerin. If she experiences any dizziness they instructed her to call their office. Pt informed and will pick up prescription.

## 2019-11-25 ENCOUNTER — Other Ambulatory Visit: Payer: Medicaid Other

## 2019-11-25 DIAGNOSIS — K602 Anal fissure, unspecified: Secondary | ICD-10-CM

## 2019-11-29 LAB — CALPROTECTIN, FECAL: Calprotectin, Fecal: 40 ug/g (ref 0–120)

## 2020-01-29 ENCOUNTER — Inpatient Hospital Stay (HOSPITAL_BASED_OUTPATIENT_CLINIC_OR_DEPARTMENT_OTHER): Payer: Medicaid Other

## 2020-01-29 ENCOUNTER — Inpatient Hospital Stay (HOSPITAL_COMMUNITY)
Admission: AD | Admit: 2020-01-29 | Discharge: 2020-01-29 | Disposition: A | Discharge status: Home / Self Care | Payer: Medicaid Other | Attending: Obstetrics and Gynecology | Admitting: Obstetrics and Gynecology

## 2020-01-29 ENCOUNTER — Other Ambulatory Visit: Payer: Self-pay

## 2020-01-29 ENCOUNTER — Encounter (HOSPITAL_COMMUNITY): Payer: Self-pay | Admitting: Obstetrics and Gynecology

## 2020-01-29 DIAGNOSIS — O368190 Decreased fetal movements, unspecified trimester, not applicable or unspecified: Secondary | ICD-10-CM | POA: Insufficient documentation

## 2020-01-29 DIAGNOSIS — Z3A33 33 weeks gestation of pregnancy: Secondary | ICD-10-CM | POA: Diagnosis not present

## 2020-01-29 DIAGNOSIS — Z3689 Encounter for other specified antenatal screening: Secondary | ICD-10-CM | POA: Diagnosis not present

## 2020-01-29 DIAGNOSIS — Z87891 Personal history of nicotine dependence: Secondary | ICD-10-CM | POA: Insufficient documentation

## 2020-01-29 DIAGNOSIS — O368130 Decreased fetal movements, third trimester, not applicable or unspecified: Secondary | ICD-10-CM | POA: Diagnosis not present

## 2020-01-29 DIAGNOSIS — Z3A32 32 weeks gestation of pregnancy: Secondary | ICD-10-CM

## 2020-01-29 IMAGING — US US MFM FETAL BPP W/O NON-STRESS
2 series · 14 of 28 positions shown · non-contrast
Comparison: none

[Series 1: us mfm fetal bpp w/o non-stress · 73 acquisitions, 13 frames shown (1 of 2)]
[im 3/73]
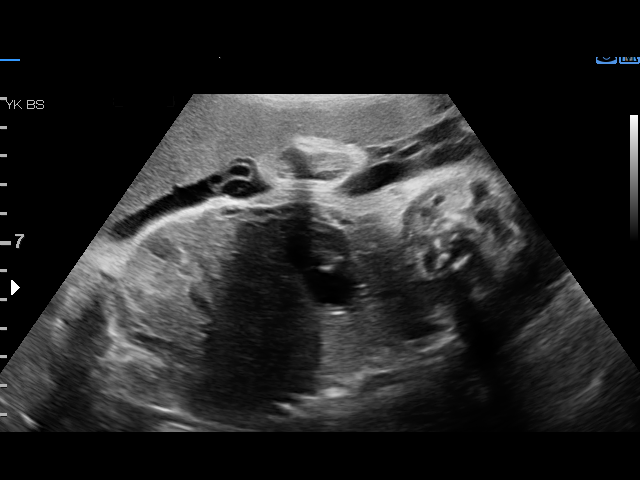
[im 9/73]
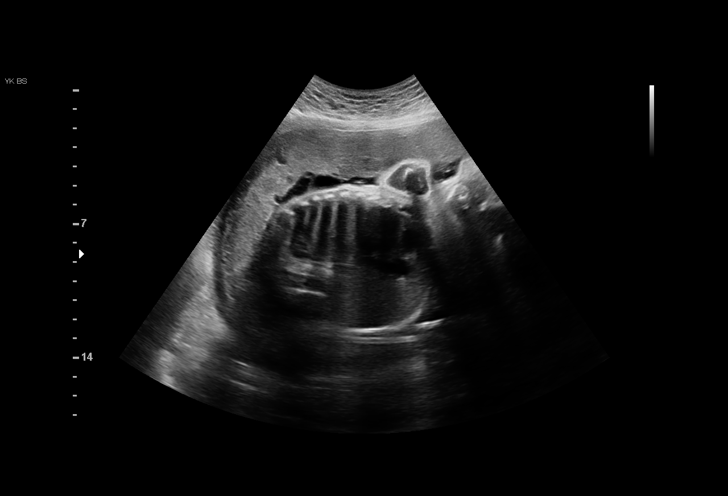
[im 14/73]
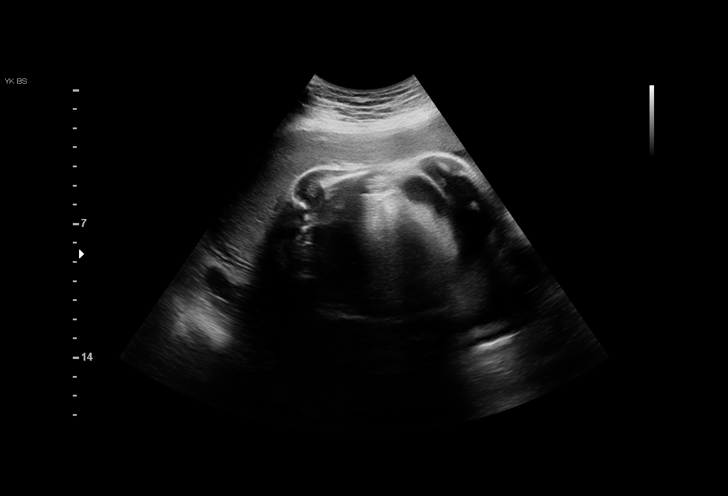
[im 20/73]
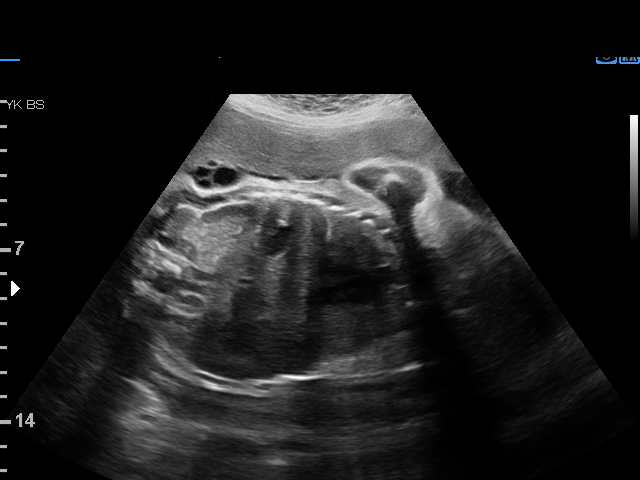
[im 25/73]
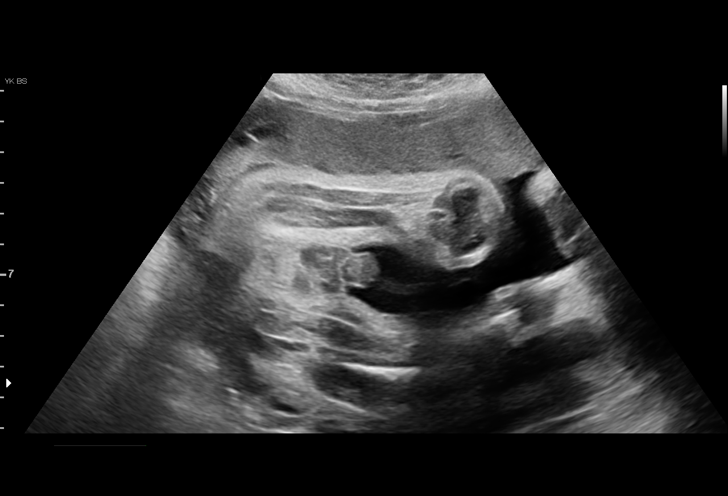
[im 31/73]
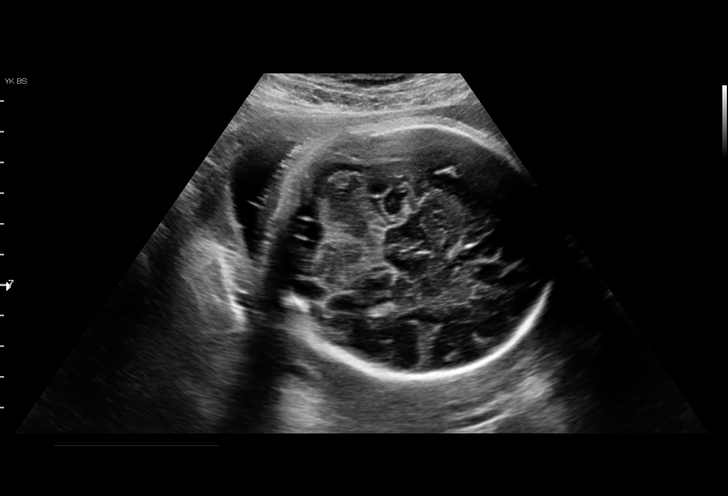
[im 37/73]
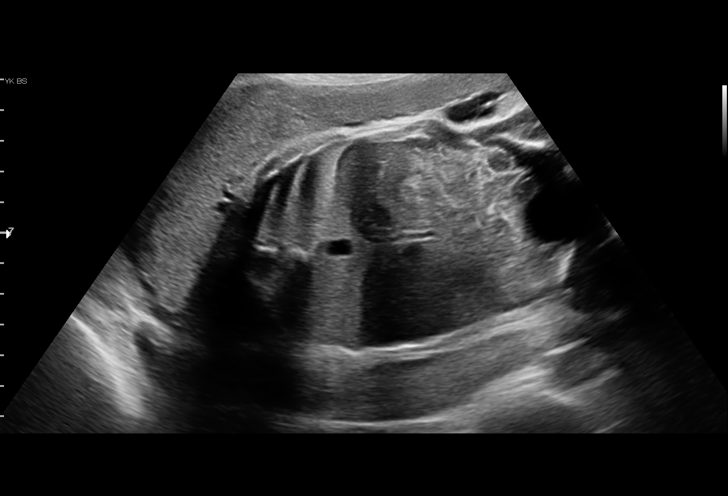
[im 42/73]
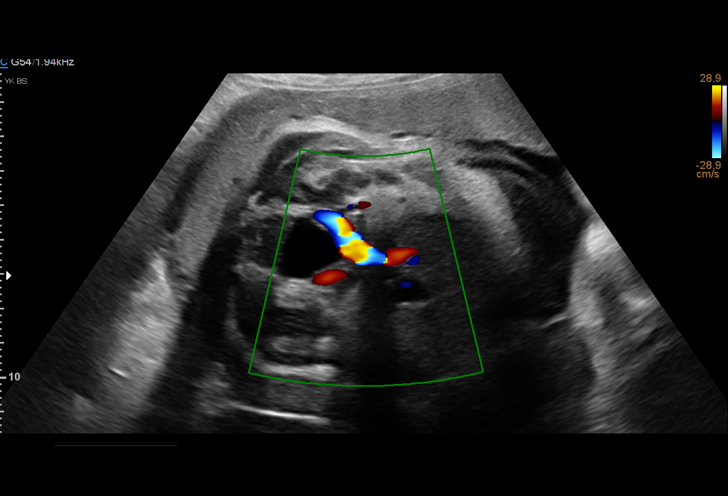
[im 48/73]
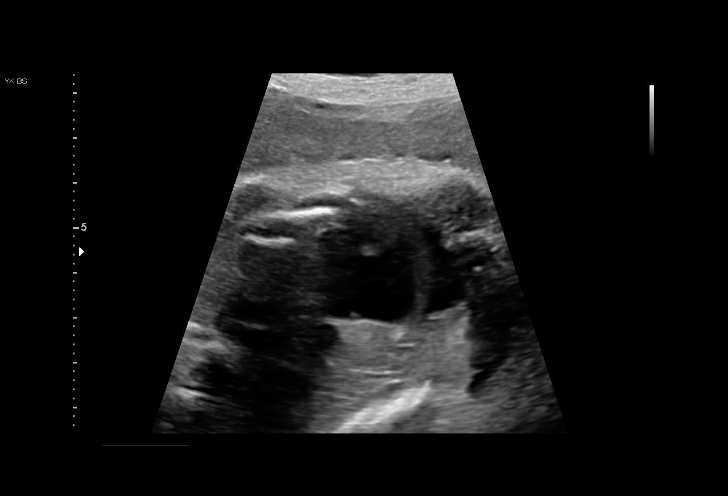
[im 53/73]
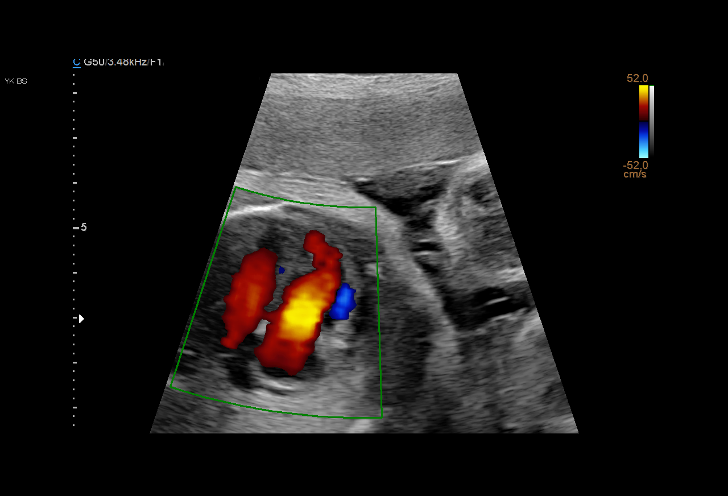
[im 59/73]
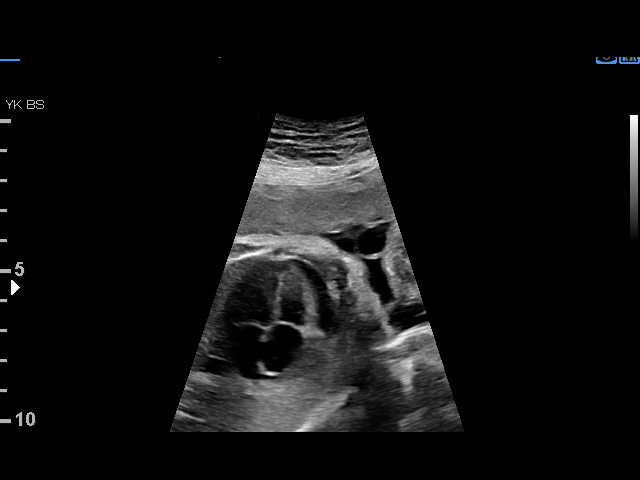
[im 64/73]
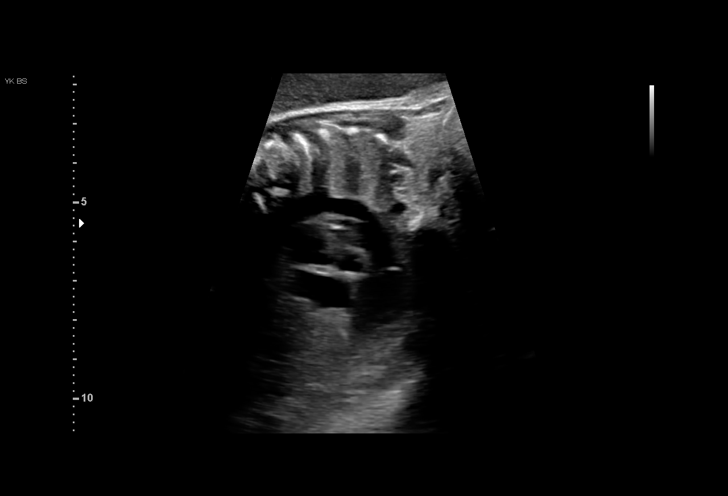
[im 70/73]
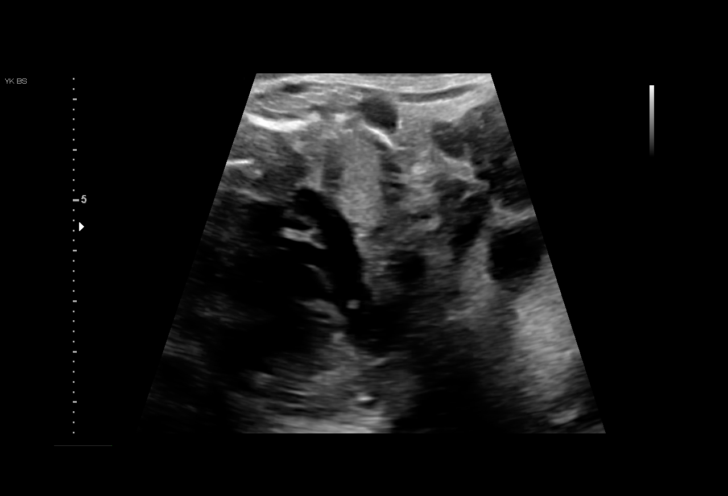

[Series 3: us mfm fetal bpp w/o non-stress · 1 of 3 slices shown (2 of 2)]
[im 1/3]
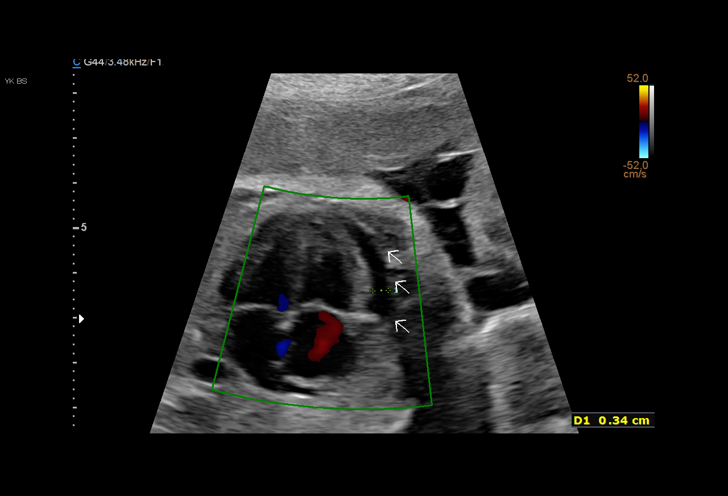

[14 of 28 positions shown; findings below may reference images not displayed]

[facility] [HOSPITAL]
 Referred By:      [facility]          [facility]
                   [facility]                              [HOSPITAL]

Indications

 Decreased fetal movements, third trimester,     [B6]
 unspecified
 32 weeks gestation of pregnancy
Fetal Evaluation

 Num Of Fetuses:          1
 Fetal Heart Rate(bpm):   154
 Cardiac Activity:        Observed
 Presentation:            Cephalic
 Placenta:                Anterior

 Amniotic Fluid
 AFI FV:      Within normal limits

 AFI Sum(cm)     %Tile       Largest Pocket(cm)
 12.7            38

 RUQ(cm)       RLQ(cm)       LUQ(cm)        LLQ(cm)
 3.9           1
Biophysical Evaluation

 Amniotic F.V:   Within normal limits       F. Tone:         Observed
 F. Movement:    Observed                   Score:           [DATE]
 F. Breathing:   Observed
OB History

 Gravidity:    1
Gestational Age

 LMP:           32w 6d        Date:  [DATE]                 EDD:   [DATE]
 Best:          32w 6d     Det. By:  LMP  ([DATE])          EDD:   [DATE]
Anatomy

 Cranium:               Appears normal         Aortic Arch:            Appears normal
 Cavum:                 Appears normal         Ductal Arch:            Appears normal
 Ventricles:            Appears normal         Diaphragm:              Appears normal
 Choroid Plexus:        Appears normal         Stomach:                Appears normal, left
                                                                       sided
 Cerebellum:            Appears normal         Abdomen:                Appears normal
 Posterior Fossa:       Appears normal         Abdominal Wall:         Appears nml (cord
                                                                       insert, abd wall)
 Thoracic:              Appears normal         Cord Vessels:           Appears normal (3
                                                                       vessel cord)
 Heart:                 Pericardial            Kidneys:                Appear normal
                        effusion
 RVOT:                  Appears normal         Bladder:                Appears normal
 LVOT:                  Appears normal

 Other:  Male gender. Technically difficult due to fetal position.
Cervix Uterus Adnexa

 Cervix
 Normal appearance by transabdominal scan.

 Uterus
 No abnormality visualized.
Impression

 Decreased fetal movement
 A biophysical profile of [DATE] is observed, in addition, a small
 pericardial effuision is observed measuring 4.7 mm
 The pericardial effusion is dependent one sided, may be
 physiologic.
Recommendations

 Follow up pericardial effusion in 1 week
 MFM Consultation recommended.

## 2020-01-29 NOTE — MAU Note (Signed)
Rachel Hanson is a 34 y.o. at 41w6dhere in MAU reporting: DFM for the past 24 hours. Has felt some but less than normal. Denies pain, bleeding, and LOF.  Onset of complaint: yesterday  Pain score: 0/10  Vitals:   01/29/20 1106  BP: 99/70  Pulse: (!) 101  Resp: 16  Temp: 99.2 F (37.3 C)  SpO2: 98%     FHT:134  Lab orders placed from triage: none

## 2020-01-29 NOTE — Discharge Instructions (Signed)
Fetal Movement Counts Patient Name: ________________________________________________ Patient Due Date: ____________________ What is a fetal movement count?  A fetal movement count is the number of times that you feel your baby move during a certain amount of time. This may also be called a fetal kick count. A fetal movement count is recommended for every pregnant woman. You may be asked to start counting fetal movements as early as week 28 of your pregnancy. Pay attention to when your baby is most active. You may notice your baby's sleep and wake cycles. You may also notice things that make your baby move more. You should do a fetal movement count:  When your baby is normally most active.  At the same time each day. A good time to count movements is while you are resting, after having something to eat and drink. How do I count fetal movements? 1. Find a quiet, comfortable area. Sit, or lie down on your side. 2. Write down the date, the start time and stop time, and the number of movements that you felt between those two times. Take this information with you to your health care visits. 3. Write down your start time when you feel the first movement. 4. Count kicks, flutters, swishes, rolls, and jabs. You should feel at least 10 movements. 5. You may stop counting after you have felt 10 movements, or if you have been counting for 2 hours. Write down the stop time. 6. If you do not feel 10 movements in 2 hours, contact your health care provider for further instructions. Your health care provider may want to do additional tests to assess your baby's well-being. Contact a health care provider if:  You feel fewer than 10 movements in 2 hours.  Your baby is not moving like he or she usually does. Date: ____________ Start time: ____________ Stop time: ____________ Movements: ____________ Date: ____________ Start time: ____________ Stop time: ____________ Movements: ____________ Date: ____________  Start time: ____________ Stop time: ____________ Movements: ____________ Date: ____________ Start time: ____________ Stop time: ____________ Movements: ____________ Date: ____________ Start time: ____________ Stop time: ____________ Movements: ____________ Date: ____________ Start time: ____________ Stop time: ____________ Movements: ____________ Date: ____________ Start time: ____________ Stop time: ____________ Movements: ____________ Date: ____________ Start time: ____________ Stop time: ____________ Movements: ____________ Date: ____________ Start time: ____________ Stop time: ____________ Movements: ____________ This information is not intended to replace advice given to you by your health care provider. Make sure you discuss any questions you have with your health care provider. Document Revised: 04/29/2019 Document Reviewed: 04/29/2019 Elsevier Patient Education  [place].

## 2020-01-29 NOTE — MAU Provider Note (Signed)
Patient Rachel Hanson is a 34 y.o. G1P0000  at 40w6dhere with complaints of decreased fetal movements. She denies LOF, vaginal bleeding, contractions. She denies complications with her blood pressure or blood sugar in this pregnancy. She denies nausea, vomiting, HA, blurry vision, pelvic, dysuria, abnormal discharge.    History     CSN: [id number] Arrival date and time: 01/29/20 1047   None     Chief Complaint  Patient presents with  . Decreased Fetal Movement   HPI Patient states that she normally feels her baby move at night when she falls asleep; she feels at least 10 movements between 11pm and 1 am. She feels the hardest kicks. She reports that the movements feel less frequent and less strong over the past 24 hours. She tried pushing on her belly, eating ice cream. This morning she had breakfast and some water and felt like the movements increased "a little bit". Then she felt 3-4 hard kicks after that (this was around 9:30).   She [facility] recommended that she come to [facility] after eating and drinking something.    Since she has been in [facility] , she has still not felt movements.  OB History    Gravida  1   Para  0   Term  0   Preterm  0   AB  0   Living  0     SAB  0   TAB  0   Ectopic  0   Multiple  0   Live Births  0           Past Medical History:  Diagnosis Date  . [place]   [facility]   History reviewed. No pertinent surgical history.  Family History  Adopted: Yes    Social History   Tobacco Use  . Smoking status: Former Smoker    Types: Cigarettes  . Smokeless tobacco: Never Used  Substance Use Topics  . Alcohol use: Not Currently    Comment: pregnant  . Drug use: Never    Allergies:  Allergies  Allergen Reactions  . Bactrim [Sulfamethoxazole-Trimethoprim]     2 episodes of facial swelling, rash following initiation of Bactrim    Medications Prior to Admission   Medication Sig Dispense Refill Last Dose  . acetaminophen (TYLENOL) 325 MG tablet Take 2 tablets (650 mg total) by mouth every 6 (six) hours as needed for moderate pain or fever. 30 tablet 3   . AMBULATORY NON FORMULARY MEDICATION Medication Name: Nitroglycerin 0.125 ointment three times a day apply rectally up until the first knuckle after fingernail for 6-8 weeks. 30 g 1   . Prenatal Vit-Fe Fumarate-FA (MULTIVITAMIN-PRENATAL) 27-0.8 MG TABS tablet Take 2 tablets by mouth daily at 12 noon.        Review of Systems Physical Exam   Blood pressure 99/70, pulse (!) 101, temperature 99.2 F (37.3 C), temperature source Oral, resp. rate 16, last menstrual period 06/17/2019, SpO2 98 %.  Physical Exam  Constitutional: She is oriented to person, place, and time. She appears well-developed.  HENT:  Head: Normocephalic.  Respiratory: Effort normal.  GI: Soft.  Musculoskeletal:     Cervical back: Normal range of motion.  Neurological: She is alert and oriented to person, place, and time.  Skin: Skin is warm and dry.    R[facility] Course  Procedures  MDM -NST: 140 bpm, mod var, present acel, neg decels, no contractions.  BPP 8/8; patient feels movements now in [facility]on.   Assessment and Plan   1. NST (non-stress test) reactive    2. Patient stable for discharge with recommendation to keep follow up appt on May 18 at [facility].   3. Reviewed warning signs of third trimester and when to return to [facility]on.   4. Reviewed kick counts and how to perform and when to come to [facility].  All questions answered.   Rachel Hanson 01/29/2020, 11:30 AM

## 2020-02-09 ENCOUNTER — Encounter: Payer: Self-pay | Admitting: Student

## 2020-02-09 DIAGNOSIS — O368990 Maternal care for other specified fetal problems, unspecified trimester, not applicable or unspecified: Secondary | ICD-10-CM | POA: Insufficient documentation

## 2020-02-14 ENCOUNTER — Other Ambulatory Visit: Payer: Self-pay | Admitting: Obstetrics and Gynecology

## 2020-02-14 DIAGNOSIS — O368990 Maternal care for other specified fetal problems, unspecified trimester, not applicable or unspecified: Secondary | ICD-10-CM

## 2020-02-15 ENCOUNTER — Ambulatory Visit: Payer: Medicaid Other | Attending: Obstetrics and Gynecology

## 2020-02-15 ENCOUNTER — Ambulatory Visit: Payer: Medicaid Other | Admitting: Obstetrics and Gynecology

## 2020-02-15 ENCOUNTER — Ambulatory Visit: Payer: Medicaid Other | Admitting: *Deleted

## 2020-02-15 ENCOUNTER — Other Ambulatory Visit: Payer: Self-pay

## 2020-02-15 VITALS — BP 98/67 | HR 96

## 2020-02-15 DIAGNOSIS — Z363 Encounter for antenatal screening for malformations: Secondary | ICD-10-CM

## 2020-02-15 DIAGNOSIS — O368990 Maternal care for other specified fetal problems, unspecified trimester, not applicable or unspecified: Secondary | ICD-10-CM

## 2020-02-15 DIAGNOSIS — O09513 Supervision of elderly primigravida, third trimester: Secondary | ICD-10-CM | POA: Insufficient documentation

## 2020-02-15 DIAGNOSIS — Z3A00 Weeks of gestation of pregnancy not specified: Secondary | ICD-10-CM | POA: Diagnosis not present

## 2020-02-15 DIAGNOSIS — F17200 Nicotine dependence, unspecified, uncomplicated: Secondary | ICD-10-CM

## 2020-02-15 DIAGNOSIS — O98513 Other viral diseases complicating pregnancy, third trimester: Secondary | ICD-10-CM

## 2020-02-15 DIAGNOSIS — O368930 Maternal care for other specified fetal problems, third trimester, not applicable or unspecified: Secondary | ICD-10-CM | POA: Diagnosis not present

## 2020-02-15 DIAGNOSIS — O99333 Smoking (tobacco) complicating pregnancy, third trimester: Secondary | ICD-10-CM

## 2020-02-15 DIAGNOSIS — Z3A35 35 weeks gestation of pregnancy: Secondary | ICD-10-CM

## 2020-02-15 DIAGNOSIS — B009 Herpesviral infection, unspecified: Secondary | ICD-10-CM

## 2020-02-15 DIAGNOSIS — O09523 Supervision of elderly multigravida, third trimester: Secondary | ICD-10-CM

## 2020-02-15 NOTE — Progress Notes (Signed)
MFM Note  This patient was seen in consultation and for a detailed ultrasound due to a small pericardial effusion that was noted on an ultrasound that was performed in the MAU.  The patient had presented to the MAU about 2 weeks ago complaining of decreased fetal movements.  The small pericardial effusion was noted on an ultrasound performed for a biophysical profile.  The patient denies any significant past medical history and denies any problems in her current pregnancy.  She had a cell free DNA test earlier in her pregnancy that indicated a low risk for trisomy 45, 36, and 13.  The fetal growth and amniotic fluid level appeared appropriate for her gestational age.   A small pericardial effusion was noted on today's ultrasound exam.  Although limited today due to her advanced gestational age, there were no obvious cardiac anomalies noted today.  The patient was reassured that the pericardial effusion is most likely a normal physiologic finding.  The patient reports that she has been upset over the past few days as she read on the Internet that the pericardial effusion may be a sign of fetal hydrops.  She was reassured that there were no signs of fetal hydrops noted today.  She was advised to have her baby examined after birth to determine if any cardiac abnormalities are present.  Due to the patient's concerns regarding the pericardial effusion noted today, she was advised to have weekly nonstress tests performed in your office.  The patient stated that she felt much better following today's ultrasound exam and following our discussion.    Thank you for referring this patient for a Maternal-Fetal Medicine consultation.  A total of 30 minutes was spent counseling and coordinating the care for this patient.  Greater than 50% of the time was spent in direct face-to-face contact.

## 2020-02-15 NOTE — Progress Notes (Unsigned)
MFM Note  This patient was seen in consultation and for a detailed ultrasound due to a small pericardial effusion that was noted on an ultrasound that was performed in the MAU.  The patient had presented to the MAU about 2 weeks ago complaining of decreased fetal movements.  The small pericardial effusion was noted on an ultrasound performed for a biophysical profile.  The patient denies any significant past medical history and denies any problems in her current pregnancy.  She had a cell free DNA test earlier in her pregnancy that indicated a low risk for trisomy 70, 62, and 13.  The fetal growth and amniotic fluid level appeared appropriate for her gestational age.   A small pericardial effusion was noted on today's ultrasound exam.  Although limited today due to her advanced gestational age, there were no obvious cardiac anomalies noted today.  The patient was reassured that the pericardial effusion is most likely a normal physiologic finding.  The patient reports that she has been upset over the past few days as she read on the Internet that the pericardial effusion may be a sign of fetal hydrops.  She was reassured that there were no signs of fetal hydrops noted today.  She was advised to have her baby examined after birth to determine if any cardiac abnormalities are present.  Due to the patient's concerns regarding the pericardial effusion noted today, she was advised to have weekly nonstress tests performed in your office.  The patient stated that she felt much better following today's ultrasound exam and following our discussion.    Thank you for referring this patient for a Maternal-Fetal Medicine consultation.  A total of 30 minutes was spent counseling and coordinating the care for this patient.  Greater than 50% of the time was spent in direct face-to-face contact.

## 2020-02-22 LAB — OB RESULTS CONSOLE GBS: GBS: NEGATIVE

## 2020-02-25 ENCOUNTER — Other Ambulatory Visit: Payer: Medicaid Other

## 2020-03-10 ENCOUNTER — Telehealth (HOSPITAL_COMMUNITY): Payer: Self-pay | Admitting: *Deleted

## 2020-03-10 ENCOUNTER — Encounter (HOSPITAL_COMMUNITY): Payer: Self-pay | Admitting: *Deleted

## 2020-03-10 NOTE — Telephone Encounter (Signed)
Preadmission screen  

## 2020-03-11 ENCOUNTER — Other Ambulatory Visit (HOSPITAL_COMMUNITY)
Admission: RE | Admit: 2020-03-11 | Discharge: 2020-03-11 | Disposition: A | Discharge status: Home / Self Care | Payer: Medicaid Other | Source: Ambulatory Visit | Attending: Obstetrics and Gynecology | Admitting: Obstetrics and Gynecology

## 2020-03-11 DIAGNOSIS — Z01812 Encounter for preprocedural laboratory examination: Secondary | ICD-10-CM | POA: Insufficient documentation

## 2020-03-11 DIAGNOSIS — Z20822 Contact with and (suspected) exposure to covid-19: Secondary | ICD-10-CM | POA: Insufficient documentation

## 2020-03-11 LAB — SARS CORONAVIRUS 2 (TAT 6-24 HRS): SARS Coronavirus 2: NEGATIVE

## 2020-03-13 ENCOUNTER — Inpatient Hospital Stay (HOSPITAL_COMMUNITY): Payer: Medicaid Other | Admitting: Anesthesiology

## 2020-03-13 ENCOUNTER — Inpatient Hospital Stay (HOSPITAL_COMMUNITY)
Admission: RE | Admit: 2020-03-13 | Discharge: 2020-03-16 | DRG: 787 | Disposition: A | Discharge status: Home / Self Care | Payer: Medicaid Other | Attending: Obstetrics and Gynecology | Admitting: Obstetrics and Gynecology

## 2020-03-13 ENCOUNTER — Encounter (HOSPITAL_COMMUNITY)
Admission: RE | Disposition: A | Discharge status: Home / Self Care | Payer: Self-pay | Source: Home / Self Care | Attending: Obstetrics and Gynecology

## 2020-03-13 ENCOUNTER — Inpatient Hospital Stay (HOSPITAL_COMMUNITY): Payer: Medicaid Other

## 2020-03-13 ENCOUNTER — Other Ambulatory Visit: Payer: Self-pay

## 2020-03-13 ENCOUNTER — Encounter (HOSPITAL_COMMUNITY): Payer: Self-pay | Admitting: Obstetrics and Gynecology

## 2020-03-13 DIAGNOSIS — D62 Acute posthemorrhagic anemia: Secondary | ICD-10-CM | POA: Diagnosis not present

## 2020-03-13 DIAGNOSIS — Z3A39 39 weeks gestation of pregnancy: Secondary | ICD-10-CM

## 2020-03-13 DIAGNOSIS — O99334 Smoking (tobacco) complicating childbirth: Secondary | ICD-10-CM | POA: Diagnosis present

## 2020-03-13 DIAGNOSIS — O358XX0 Maternal care for other (suspected) fetal abnormality and damage, not applicable or unspecified: Principal | ICD-10-CM | POA: Diagnosis present

## 2020-03-13 DIAGNOSIS — O9832 Other infections with a predominantly sexual mode of transmission complicating childbirth: Secondary | ICD-10-CM | POA: Diagnosis present

## 2020-03-13 DIAGNOSIS — K5190 Ulcerative colitis, unspecified, without complications: Secondary | ICD-10-CM | POA: Diagnosis present

## 2020-03-13 DIAGNOSIS — F17210 Nicotine dependence, cigarettes, uncomplicated: Secondary | ICD-10-CM | POA: Diagnosis present

## 2020-03-13 DIAGNOSIS — O9081 Anemia of the puerperium: Secondary | ICD-10-CM | POA: Diagnosis not present

## 2020-03-13 DIAGNOSIS — A6000 Herpesviral infection of urogenital system, unspecified: Secondary | ICD-10-CM | POA: Diagnosis present

## 2020-03-13 DIAGNOSIS — Z98891 History of uterine scar from previous surgery: Secondary | ICD-10-CM

## 2020-03-13 DIAGNOSIS — O9962 Diseases of the digestive system complicating childbirth: Secondary | ICD-10-CM | POA: Diagnosis present

## 2020-03-13 LAB — CBC
HCT: 37.1 % (ref 36.0–46.0)
Hemoglobin: 12 g/dL (ref 12.0–15.0)
MCH: 32.4 pg (ref 26.0–34.0)
MCHC: 32.3 g/dL (ref 30.0–36.0)
MCV: 100.3 fL — ABNORMAL HIGH (ref 80.0–100.0)
Platelets: 255 10*3/uL (ref 150–400)
RBC: 3.7 MIL/uL — ABNORMAL LOW (ref 3.87–5.11)
RDW: 14.5 % (ref 11.5–15.5)
WBC: 8.6 10*3/uL (ref 4.0–10.5)
nRBC: 0 % (ref 0.0–0.2)

## 2020-03-13 LAB — RPR: RPR Ser Ql: NONREACTIVE

## 2020-03-13 LAB — ABO/RH: ABO/RH(D): B POS

## 2020-03-13 LAB — TYPE AND SCREEN
ABO/RH(D): B POS
Antibody Screen: NEGATIVE

## 2020-03-13 SURGERY — Surgical Case
Anesthesia: Spinal | Wound class: Clean Contaminated

## 2020-03-13 MED ORDER — MISOPROSTOL 25 MCG QUARTER TABLET
25.0000 ug | ORAL_TABLET | ORAL | Status: DC | PRN
  Administered 2020-03-13 (×2): 25 ug via VAGINAL
  Filled 2020-03-13 (×2): qty 1

## 2020-03-13 MED ORDER — SOD CITRATE-CITRIC ACID 500-334 MG/5ML PO SOLN
30.0000 mL | ORAL | Status: DC | PRN
  Administered 2020-03-13: 30 mL via ORAL
  Filled 2020-03-13: qty 30

## 2020-03-13 MED ORDER — ONDANSETRON HCL 4 MG/2ML IJ SOLN
INTRAMUSCULAR | Status: DC | PRN
  Administered 2020-03-13: 4 mg via INTRAVENOUS

## 2020-03-13 MED ORDER — SIMETHICONE 80 MG PO CHEW
80.0000 mg | CHEWABLE_TABLET | ORAL | Status: DC
  Administered 2020-03-14 – 2020-03-15 (×2): 80 mg via ORAL
  Filled 2020-03-13 (×3): qty 1

## 2020-03-13 MED ORDER — LACTATED RINGERS IV SOLN: INTRAVENOUS | Status: DC | PRN

## 2020-03-13 MED ORDER — BUTORPHANOL TARTRATE 1 MG/ML IJ SOLN: 1.0000 mg | INTRAMUSCULAR | Status: DC | PRN

## 2020-03-13 MED ORDER — OXYCODONE HCL 5 MG PO TABS: 5.0000 mg | ORAL_TABLET | Freq: Once | ORAL | Status: DC | PRN

## 2020-03-13 MED ORDER — MENTHOL 3 MG MT LOZG: 1.0000 | LOZENGE | OROMUCOSAL | Status: DC | PRN

## 2020-03-13 MED ORDER — CEFAZOLIN SODIUM-DEXTROSE 2-4 GM/100ML-% IV SOLN
2.0000 g | Freq: Once | INTRAVENOUS | Status: AC
  Administered 2020-03-13: 2 g via INTRAVENOUS

## 2020-03-13 MED ORDER — PHENYLEPHRINE HCL-NACL 20-0.9 MG/250ML-% IV SOLN
INTRAVENOUS | Status: DC | PRN
  Administered 2020-03-13: 60 ug/min via INTRAVENOUS

## 2020-03-13 MED ORDER — SCOPOLAMINE 1 MG/3DAYS TD PT72
1.0000 | MEDICATED_PATCH | Freq: Once | TRANSDERMAL | Status: DC
  Administered 2020-03-13: 1.5 mg via TRANSDERMAL
  Filled 2020-03-13: qty 1

## 2020-03-13 MED ORDER — NALBUPHINE HCL 10 MG/ML IJ SOLN: 5.0000 mg | INTRAMUSCULAR | Status: DC | PRN

## 2020-03-13 MED ORDER — OXYTOCIN BOLUS FROM INFUSION: 333.0000 mL | Freq: Once | INTRAVENOUS | Status: DC

## 2020-03-13 MED ORDER — DEXAMETHASONE SODIUM PHOSPHATE 10 MG/ML IJ SOLN
INTRAMUSCULAR | Status: AC
  Filled 2020-03-13: qty 1

## 2020-03-13 MED ORDER — MISOPROSTOL 50MCG HALF TABLET
50.0000 ug | ORAL_TABLET | ORAL | Status: DC
  Administered 2020-03-13: 50 ug via ORAL
  Filled 2020-03-13: qty 1

## 2020-03-13 MED ORDER — METHYLERGONOVINE MALEATE 0.2 MG/ML IJ SOLN
INTRAMUSCULAR | Status: AC
  Filled 2020-03-13: qty 1

## 2020-03-13 MED ORDER — NALBUPHINE HCL 10 MG/ML IJ SOLN: 5.0000 mg | Freq: Once | INTRAMUSCULAR | Status: DC | PRN

## 2020-03-13 MED ORDER — SODIUM CHLORIDE 0.9 % IV SOLN: INTRAVENOUS | Status: DC | PRN

## 2020-03-13 MED ORDER — OXYTOCIN-SODIUM CHLORIDE 30-0.9 UT/500ML-% IV SOLN: 2.5000 [IU]/h | INTRAVENOUS | Status: AC

## 2020-03-13 MED ORDER — OXYCODONE HCL 5 MG PO TABS
5.0000 mg | ORAL_TABLET | ORAL | Status: DC | PRN
  Administered 2020-03-14: 10 mg via ORAL
  Administered 2020-03-14: 5 mg via ORAL
  Administered 2020-03-15: 10 mg via ORAL
  Administered 2020-03-15: 5 mg via ORAL
  Administered 2020-03-15 – 2020-03-16 (×3): 10 mg via ORAL
  Administered 2020-03-16: 5 mg via ORAL
  Filled 2020-03-13: qty 1
  Filled 2020-03-13 (×2): qty 2
  Filled 2020-03-13: qty 1
  Filled 2020-03-13 (×2): qty 2
  Filled 2020-03-13: qty 1
  Filled 2020-03-13: qty 2

## 2020-03-13 MED ORDER — SIMETHICONE 80 MG PO CHEW
80.0000 mg | CHEWABLE_TABLET | Freq: Three times a day (TID) | ORAL | Status: DC
  Administered 2020-03-14 – 2020-03-16 (×7): 80 mg via ORAL
  Filled 2020-03-13 (×6): qty 1

## 2020-03-13 MED ORDER — DEXAMETHASONE SODIUM PHOSPHATE 4 MG/ML IJ SOLN
INTRAMUSCULAR | Status: DC | PRN
  Administered 2020-03-13: 10 mg via INTRAVENOUS

## 2020-03-13 MED ORDER — LACTATED RINGERS IV SOLN: INTRAVENOUS | Status: DC

## 2020-03-13 MED ORDER — SENNOSIDES-DOCUSATE SODIUM 8.6-50 MG PO TABS
2.0000 | ORAL_TABLET | ORAL | Status: DC
  Administered 2020-03-14 – 2020-03-15 (×3): 2 via ORAL
  Filled 2020-03-13 (×3): qty 2

## 2020-03-13 MED ORDER — ONDANSETRON HCL 4 MG/2ML IJ SOLN
INTRAMUSCULAR | Status: AC
  Filled 2020-03-13: qty 2

## 2020-03-13 MED ORDER — DIPHENHYDRAMINE HCL 50 MG/ML IJ SOLN: 12.5000 mg | INTRAMUSCULAR | Status: DC | PRN

## 2020-03-13 MED ORDER — OXYTOCIN-SODIUM CHLORIDE 30-0.9 UT/500ML-% IV SOLN: 2.5000 [IU]/h | INTRAVENOUS | Status: DC

## 2020-03-13 MED ORDER — MORPHINE SULFATE (PF) 0.5 MG/ML IJ SOLN
INTRAMUSCULAR | Status: AC
  Filled 2020-03-13: qty 10

## 2020-03-13 MED ORDER — LIDOCAINE HCL (PF) 1 % IJ SOLN: 30.0000 mL | INTRAMUSCULAR | Status: DC | PRN

## 2020-03-13 MED ORDER — ONDANSETRON HCL 4 MG/2ML IJ SOLN: 4.0000 mg | Freq: Four times a day (QID) | INTRAMUSCULAR | Status: DC | PRN

## 2020-03-13 MED ORDER — TETANUS-DIPHTH-ACELL PERTUSSIS 5-2.5-18.5 LF-MCG/0.5 IM SUSP: 0.5000 mL | Freq: Once | INTRAMUSCULAR | Status: DC

## 2020-03-13 MED ORDER — FENTANYL CITRATE (PF) 100 MCG/2ML IJ SOLN: 25.0000 ug | INTRAMUSCULAR | Status: DC | PRN

## 2020-03-13 MED ORDER — MORPHINE SULFATE (PF) 0.5 MG/ML IJ SOLN
INTRAMUSCULAR | Status: DC | PRN
  Administered 2020-03-13: .15 mg via INTRATHECAL

## 2020-03-13 MED ORDER — WITCH HAZEL-GLYCERIN EX PADS: 1.0000 "application " | MEDICATED_PAD | CUTANEOUS | Status: DC | PRN

## 2020-03-13 MED ORDER — NALOXONE HCL 0.4 MG/ML IJ SOLN: 0.4000 mg | INTRAMUSCULAR | Status: DC | PRN

## 2020-03-13 MED ORDER — OXYTOCIN-SODIUM CHLORIDE 30-0.9 UT/500ML-% IV SOLN
INTRAVENOUS | Status: DC | PRN
  Administered 2020-03-13: 30 [IU] via INTRAVENOUS

## 2020-03-13 MED ORDER — DIPHENHYDRAMINE HCL 25 MG PO CAPS: 25.0000 mg | ORAL_CAPSULE | Freq: Four times a day (QID) | ORAL | Status: DC | PRN

## 2020-03-13 MED ORDER — LACTATED RINGERS IV SOLN: 500.0000 mL | INTRAVENOUS | Status: DC | PRN

## 2020-03-13 MED ORDER — PHENYLEPHRINE HCL-NACL 20-0.9 MG/250ML-% IV SOLN
INTRAVENOUS | Status: AC
  Filled 2020-03-13: qty 250

## 2020-03-13 MED ORDER — SODIUM CHLORIDE 0.9% FLUSH: 3.0000 mL | INTRAVENOUS | Status: DC | PRN

## 2020-03-13 MED ORDER — OXYCODONE-ACETAMINOPHEN 5-325 MG PO TABS: 2.0000 | ORAL_TABLET | ORAL | Status: DC | PRN

## 2020-03-13 MED ORDER — ACETAMINOPHEN 325 MG PO TABS: 650.0000 mg | ORAL_TABLET | ORAL | Status: DC | PRN

## 2020-03-13 MED ORDER — KETOROLAC TROMETHAMINE 30 MG/ML IJ SOLN
INTRAMUSCULAR | Status: AC
  Filled 2020-03-13: qty 1

## 2020-03-13 MED ORDER — KETOROLAC TROMETHAMINE 30 MG/ML IJ SOLN
30.0000 mg | Freq: Once | INTRAMUSCULAR | Status: AC | PRN
  Administered 2020-03-13: 30 mg via INTRAVENOUS

## 2020-03-13 MED ORDER — FENTANYL CITRATE (PF) 100 MCG/2ML IJ SOLN
INTRAMUSCULAR | Status: DC | PRN
  Administered 2020-03-13: 15 ug via INTRATHECAL

## 2020-03-13 MED ORDER — ZOLPIDEM TARTRATE 5 MG PO TABS: 5.0000 mg | ORAL_TABLET | Freq: Every evening | ORAL | Status: DC | PRN

## 2020-03-13 MED ORDER — METHYLERGONOVINE MALEATE 0.2 MG/ML IJ SOLN
INTRAMUSCULAR | Status: DC | PRN
  Administered 2020-03-13: .2 mg via INTRAMUSCULAR

## 2020-03-13 MED ORDER — MISOPROSTOL 200 MCG PO TABS
200.0000 ug | ORAL_TABLET | ORAL | Status: DC | PRN
Start: 2020-03-13 — End: 2020-03-13

## 2020-03-13 MED ORDER — CEFAZOLIN SODIUM-DEXTROSE 2-4 GM/100ML-% IV SOLN
INTRAVENOUS | Status: AC
  Filled 2020-03-13: qty 100

## 2020-03-13 MED ORDER — COCONUT OIL OIL
1.0000 "application " | TOPICAL_OIL | Status: DC | PRN
  Administered 2020-03-14: 1 via TOPICAL

## 2020-03-13 MED ORDER — OXYCODONE HCL 5 MG/5ML PO SOLN: 5.0000 mg | Freq: Once | ORAL | Status: DC | PRN

## 2020-03-13 MED ORDER — NALOXONE HCL 4 MG/10ML IJ SOLN
1.0000 ug/kg/h | INTRAVENOUS | Status: DC | PRN
  Filled 2020-03-13: qty 5

## 2020-03-13 MED ORDER — DIBUCAINE (PERIANAL) 1 % EX OINT: 1.0000 "application " | TOPICAL_OINTMENT | CUTANEOUS | Status: DC | PRN

## 2020-03-13 MED ORDER — ONDANSETRON HCL 4 MG/2ML IJ SOLN: 4.0000 mg | Freq: Once | INTRAMUSCULAR | Status: DC | PRN

## 2020-03-13 MED ORDER — PRENATAL MULTIVITAMIN CH
1.0000 | ORAL_TABLET | Freq: Every day | ORAL | Status: DC
  Administered 2020-03-14 – 2020-03-15 (×2): 1 via ORAL
  Filled 2020-03-13 (×2): qty 1

## 2020-03-13 MED ORDER — GABAPENTIN 100 MG PO CAPS
100.0000 mg | ORAL_CAPSULE | Freq: Two times a day (BID) | ORAL | Status: DC
  Administered 2020-03-14 – 2020-03-16 (×6): 100 mg via ORAL
  Filled 2020-03-13 (×6): qty 1

## 2020-03-13 MED ORDER — ONDANSETRON HCL 4 MG/2ML IJ SOLN: 4.0000 mg | Freq: Three times a day (TID) | INTRAMUSCULAR | Status: DC | PRN

## 2020-03-13 MED ORDER — DIPHENHYDRAMINE HCL 25 MG PO CAPS: 25.0000 mg | ORAL_CAPSULE | ORAL | Status: DC | PRN

## 2020-03-13 MED ORDER — ACETAMINOPHEN 500 MG PO TABS
1000.0000 mg | ORAL_TABLET | Freq: Four times a day (QID) | ORAL | Status: DC
  Administered 2020-03-13 – 2020-03-16 (×9): 1000 mg via ORAL
  Filled 2020-03-13 (×11): qty 2

## 2020-03-13 MED ORDER — OXYCODONE-ACETAMINOPHEN 5-325 MG PO TABS: 1.0000 | ORAL_TABLET | ORAL | Status: DC | PRN

## 2020-03-13 MED ORDER — SIMETHICONE 80 MG PO CHEW: 80.0000 mg | CHEWABLE_TABLET | ORAL | Status: DC | PRN

## 2020-03-13 MED ORDER — FENTANYL CITRATE (PF) 100 MCG/2ML IJ SOLN
INTRAMUSCULAR | Status: AC
  Filled 2020-03-13: qty 2

## 2020-03-13 SURGICAL SUPPLY — 44 items
BENZOIN TINCTURE PRP APPL 2/3 (GAUZE/BANDAGES/DRESSINGS) ×3 IMPLANT
BINDER ABDOMINAL 12 ML 46-62 (SOFTGOODS) ×3 IMPLANT
CHLORAPREP W/TINT 26ML (MISCELLANEOUS) ×3 IMPLANT
CLAMP CORD UMBIL (MISCELLANEOUS) IMPLANT
CLOSURE WOUND 1/2 X4 (GAUZE/BANDAGES/DRESSINGS) ×1
CLOTH BEACON ORANGE TIMEOUT ST (SAFETY) ×3 IMPLANT
DRAPE C SECTION CLR SCREEN (DRAPES) ×3 IMPLANT
DRSG OPSITE POSTOP 4X10 (GAUZE/BANDAGES/DRESSINGS) ×3 IMPLANT
DRSG PAD ABDOMINAL 8X10 ST (GAUZE/BANDAGES/DRESSINGS) ×3 IMPLANT
ELECT REM PT RETURN 9FT ADLT (ELECTROSURGICAL) ×3
ELECTRODE REM PT RTRN 9FT ADLT (ELECTROSURGICAL) ×1 IMPLANT
EXTRACTOR VACUUM KIWI (MISCELLANEOUS) IMPLANT
GAUZE SPONGE 4X4 12PLY STRL LF (GAUZE/BANDAGES/DRESSINGS) ×6 IMPLANT
GAUZE SPONGE 4X4 3PLY NS LF (GAUZE/BANDAGES/DRESSINGS) ×6 IMPLANT
GLOVE BIO SURGEON STRL SZ 6.5 (GLOVE) ×2 IMPLANT
GLOVE BIO SURGEONS STRL SZ 6.5 (GLOVE) ×1
GLOVE BIOGEL PI IND STRL 7.0 (GLOVE) ×2 IMPLANT
GLOVE BIOGEL PI INDICATOR 7.0 (GLOVE) ×4
GOWN STRL REUS W/TWL LRG LVL3 (GOWN DISPOSABLE) ×6 IMPLANT
KIT ABG SYR 3ML LUER SLIP (SYRINGE) IMPLANT
NEEDLE HYPO 25X5/8 SAFETYGLIDE (NEEDLE) IMPLANT
NS IRRIG 1000ML POUR BTL (IV SOLUTION) ×3 IMPLANT
PACK C SECTION WH (CUSTOM PROCEDURE TRAY) ×3 IMPLANT
PAD ABD 7.5X8 STRL (GAUZE/BANDAGES/DRESSINGS) ×3 IMPLANT
PAD OB MATERNITY 4.3X12.25 (PERSONAL CARE ITEMS) ×3 IMPLANT
RETRACTOR WND ALEXIS 25 LRG (MISCELLANEOUS) ×1 IMPLANT
RTRCTR C-SECT PINK 25CM LRG (MISCELLANEOUS) IMPLANT
RTRCTR WOUND ALEXIS 25CM LRG (MISCELLANEOUS) ×3
SPONGE LAP 18X18 RF (DISPOSABLE) ×3 IMPLANT
STRIP CLOSURE SKIN 1/2X4 (GAUZE/BANDAGES/DRESSINGS) ×2 IMPLANT
SUT CHROMIC 1 CTX 36 (SUTURE) ×6 IMPLANT
SUT PLAIN 0 NONE (SUTURE) IMPLANT
SUT PLAIN 2 0 XLH (SUTURE) ×3 IMPLANT
SUT VIC AB 0 CT1 27 (SUTURE) ×4
SUT VIC AB 0 CT1 27XBRD ANBCTR (SUTURE) ×2 IMPLANT
SUT VIC AB 2-0 CT1 (SUTURE) ×3 IMPLANT
SUT VIC AB 2-0 CT1 27 (SUTURE) ×2
SUT VIC AB 2-0 CT1 TAPERPNT 27 (SUTURE) ×1 IMPLANT
SUT VIC AB 3-0 CT1 27 (SUTURE)
SUT VIC AB 3-0 CT1 TAPERPNT 27 (SUTURE) IMPLANT
SUT VIC AB 4-0 KS 27 (SUTURE) ×3 IMPLANT
TOWEL OR 17X24 6PK STRL BLUE (TOWEL DISPOSABLE) ×3 IMPLANT
TRAY FOLEY W/BAG SLVR 14FR LF (SET/KITS/TRAYS/PACK) ×3 IMPLANT
WATER STERILE IRR 1000ML POUR (IV SOLUTION) ×3 IMPLANT

## 2020-03-13 NOTE — H&P (Signed)
Rachel Hanson is a 34 y.o.prime female presenting for induction of labor at 21 1/[redacted]wks gestation. Baby was noted to have a pericardial effusion at [redacted] weeks gestation. Fluid has persisted on subsequent BPPs Pt is dated per LMP which was confirmed by a 10week [place]. 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 She has a history of type 1 herpes only - never genital lesion. She has no lesions at this time. She is GBS neg. Neg panorama and essential panel  OB History    Gravida  1   Para  0   Term  0   Preterm  0   AB  0   Living  0     SAB  0   TAB  0   Ectopic  0   Multiple  0   Live Births  0          Past Medical History:  Diagnosis Date  . Bartholin cyst   . HSV (herpes simplex virus) anogenital infection   . Ulcerative colitis ([place]) 2001   when she was in [facility]   Past Surgical History:  Procedure Laterality Date  . CYSTECTOMY     L wrist   Family History: family history is not on file. She was adopted. Social History:  reports that she has been smoking cigarettes. She has been smoking about 0.25 packs per day. She has never used smokeless tobacco. She reports previous alcohol use. She reports that she does not use drugs.     Maternal Diabetes: No Genetic Screening: Normal Maternal Ultrasounds/Referrals: Other:pericardial effusion Fetal Ultrasounds or other Referrals:  Referred to Materal Fetal Medicine  Maternal Substance Abuse:  Yes:  Type: Smoker Significant Maternal Medications:  None Significant Maternal Lab Results:  Group B Strep negative Other Comments:  None  Review of Systems  Constitutional: Negative for activity change, appetite change, chills and fatigue.  Eyes: Negative for photophobia and visual disturbance.  Respiratory: Negative for shortness of breath.   Cardiovascular: Negative for chest pain and leg swelling.  Musculoskeletal:  Negative for myalgias.  Neurological: Negative for light-headedness and headaches.  Psychiatric/Behavioral: The patient is not nervous/anxious.    Maternal Medical History:  Reason for admission: Fetal indicatio - persistent pericardial effusion  Contractions: Frequency: rare.   Perceived severity is mild.    Fetal activity: Perceived fetal activity is none.    Prenatal complications: no prenatal complications Prenatal Complications - Diabetes: none.    Dilation: Closed Effacement (%): Thick Station: Ballotable Exam by:: S moyer RN Blood pressure (!) 83/43, pulse (!) 58, temperature 97.8 F (36.6 C), temperature source Oral, resp. rate 16, height 5' 2"  (1.575 m), weight 67.1 kg, last menstrual period 06/13/2019. Maternal Exam:  Uterine Assessment: Contraction strength is mild.  Contraction frequency is rare.   Abdomen: Patient reports generalized tenderness.  Estimated fetal weight is AGA.   Fetal presentation: vertex  Introitus: Normal vulva. Vulva is negative for condylomata and lesion.  Normal vagina.  Vagina is negative for condylomata.  Pelvis: adequate for delivery.   Cervix: Cervix evaluated by digital exam.     Fetal Exam Fetal Monitor Review: Baseline rate: 145.  Variability: moderate (6-25 bpm).   Pattern: accelerations present and no decelerations.    Fetal State Assessment: Category I - tracings are normal.     Physical Exam  Cardiovascular: Normal pulses.  Respiratory: Effort normal.  GI: Bowel sounds  are normal. There is generalized abdominal tenderness.  Genitourinary:    Vulva normal.     No vulval condylomata or lesion noted.   Musculoskeletal:     Cervical back: Normal range of motion.  Neurological: She is alert.  Skin: Skin is warm.  Psychiatric: Mood normal.    Prenatal labs: ABO, Rh: --/--/B POS Performed at [facility], [place] [address], [place], [place] [address]  [phone]/21 0110) Antibody: NEG (06/21 0100) Rubella: Immune  (12/03 0000) RPR: Nonreactive (12/03 0000)  HBsAg: Negative (12/03 0000)  HIV: Non-reactive (12/03 0000)  GBS: Negative/-- (06/01 0000)   Assessment/Plan: 34yo prime at 39 1/7wks , baby with pericardial effusion, hx anal fissure - S/P cytotec x 2 - second dose placed at 730am - Recheck prn    Venetia Night Sricharan Lacomb 03/13/2020, 9:26 AM

## 2020-03-13 NOTE — Anesthesia Preprocedure Evaluation (Signed)
Anesthesia Evaluation  Patient identified by MRN, date of birth, ID band Patient awake    Reviewed: Allergy & Precautions, H&P , NPO status , Patient's Chart, lab work & pertinent test results  History of Anesthesia Complications Negative for: history of anesthetic complications  Airway Mallampati: II  TM Distance: >3 FB Neck ROM: full    Dental no notable dental hx.    Pulmonary neg pulmonary ROS, Current Smoker and Patient abstained from smoking.,    Pulmonary exam normal        Cardiovascular negative cardio ROS Normal cardiovascular exam     Neuro/Psych negative neurological ROS  negative psych ROS   GI/Hepatic Neg liver ROS, Ulcerative colitis   Endo/Other  negative endocrine ROS  Renal/GU negative Renal ROS  negative genitourinary   Musculoskeletal   Abdominal   Peds  Hematology negative hematology ROS (+)   Anesthesia Other Findings   Reproductive/Obstetrics (+) Pregnancy                             Anesthesia Physical Anesthesia Plan  ASA: II  Anesthesia Plan: Spinal   Post-op Pain Management:    Induction:   PONV Risk Score and Plan: Ondansetron and Treatment may vary due to age or medical condition  Airway Management Planned:   Additional Equipment:   Intra-op Plan:   Post-operative Plan:   Informed Consent: I have reviewed the patients History and Physical, chart, labs and discussed the procedure including the risks, benefits and alternatives for the proposed anesthesia with the patient or authorized representative who has indicated his/her understanding and acceptance.       Plan Discussed with:   Anesthesia Plan Comments:         Anesthesia Quick Evaluation

## 2020-03-13 NOTE — Transfer of Care (Signed)
Immediate Anesthesia Transfer of Care Note  Patient: Rachel Hanson  Procedure(s) Performed: CESAREAN SECTION (N/A )  Patient Location: PACU  Anesthesia Type:Spinal  Level of Consciousness: awake  Airway & Oxygen Therapy: Patient Spontanous Breathing  Post-op Assessment: Report given to RN and Post -op Vital signs reviewed and stable  Post vital signs: Reviewed and stable  Last Vitals:  Vitals Value Taken Time  BP 102/67 03/13/20 1821  Temp    Pulse 64 03/13/20 1824  Resp 18 03/13/20 1824  SpO2 100 % 03/13/20 1824  Vitals shown include unvalidated device data.  Last Pain:  Vitals:   03/13/20 1646  TempSrc: Oral         Complications: No complications documented.

## 2020-03-13 NOTE — Progress Notes (Signed)
Patient ID: Rachel Hanson, female   DOB: [birth year], 34 y.o.   MRN: [id number] Pt with no complaints VSS GEN - NAD EFM - cat 1, 145 TOCO - contractions irregularly SVE -  Closed; questionable blistered area on left labia - nontender, no drainage  A/P: Prime at term s/p cytotec x 3         Pt and FOB will discuss whether want to err on side of caution and proceed with a primary cesarean section

## 2020-03-13 NOTE — Anesthesia Postprocedure Evaluation (Signed)
Anesthesia Post Note  Patient: Meri Pelot Tensley  Procedure(s) Performed: CESAREAN SECTION (N/A )     Patient location during evaluation: PACU Anesthesia Type: Spinal Level of consciousness: awake and alert Pain management: pain level controlled Vital Signs Assessment: post-procedure vital signs reviewed and stable Respiratory status: spontaneous breathing and respiratory function stable Cardiovascular status: blood pressure returned to baseline and stable Postop Assessment: spinal receding Anesthetic complications: no   No complications documented.  Last Vitals:  Vitals:   03/13/20 1832 03/13/20 1845  BP:  102/68  Pulse: (!) 59 (!) 58  Resp: 15 16  Temp:    SpO2: 99% 97%    Last Pain:  Vitals:   03/13/20 1823  TempSrc: Oral   Pain Goal:    LLE Motor Response: Non-purposeful movement (03/13/20 1832)   RLE Motor Response: Non-purposeful movement (03/13/20 1832)       Epidural/Spinal Function Cutaneous sensation: No Sensation (03/13/20 1825), Patient able to flex knees: No (03/13/20 1825), Patient able to lift hips off bed: No (03/13/20 1825), Back pain beyond tenderness at insertion site: No (03/13/20 1825), Progressively worsening motor and/or sensory loss: No (03/13/20 1825)  Helena Sardo

## 2020-03-13 NOTE — Progress Notes (Signed)
Patient ID: Rachel Hanson, female   DOB: 12-May-1986, 34 y.o.   MRN: [id number] After reviewing risks and benefits, pt and partner expressed desire for delivery via primary cesarean section Procedure, expectations, risks and benefits reviewed All questions answered Pt to OR when ready NPO  SCDs to OR Ancef 2gm to be given

## 2020-03-13 NOTE — Op Note (Addendum)
Operative Note    Preoperative Diagnosis: IUP at 71 1/[redacted]wks gestation                                             Suspected herpes genital lesion    Postoperative Diagnosis: Same    Procedure: Primary low transverse cesarean section with double layered closure   Surgeon: Mickle Mallory DO  Anesthesia: Spinal  Fluids: LR 2330m EBL: 2554mUOP: 75021m Findings: Viable female infant in vertex position. Grossly normal uterus, ovaries and tubes; boggy uterus post delivery of placenta. APGARS 9,9, weight pending   Specimen: Placenta to L/D  Preop: Pts fiancee informed us [place] induction had started ( s/p cytotec x 3 but cervix still closed) that he found a suspicious lesion on pts labia while attempting to shave her yesterday. Lesion not distinctly visible today. Reviewed risks and benefits of delivery via vagina vs cesarean section given finding. Pt and partner opt for cesearean section,  Procedure Note Patient was taken to the operating room where spinal anesthesia was administered and found to be adequate. She was placed in the dorsal supine position with a leftward tilt. A foley was placed  in a sterile manner to drain the bladder.  Pt was prepped and draped in the usual sterile fashion. An appropriate time out was performed. Allis clamp test confirmed adequate anesthesia. A Pfannenstiel skin incision was then made with the scalpel and carried through to the underlying layer of fascia by sharp dissection and Bovie cautery. The fascia was nicked in the midline and the incision was extended laterally with Mayo scissors. The superior, then inferior, aspects of the incision were grasped with kocher clamps and dissected off the underlying rectus muscles. Rectus muscles were separated in the midline and the peritoneal cavity entered bluntly. The peritoneal incision was then extended both superiorly and inferiorly with careful attention to avoid both bowel and bladder. The Alexis self-retaining wound  retractor was then placed within the incision and the lower uterine segment exposed. The bladder flap was not well ascribed thus not able to be developed.The lower uterine segment was then incised in a transverse fashion and the cavity itself entered bluntly.  The vertex was then gently lifted and delivered from the incision without difficulty.  The remainder of the infant delivered easily. Bulb suction of mouth and nose was performed.  After a minute delay, the cord was clamped and cut. The infant was handed off to the waiting NICU team. Cord blood was obtained. The placenta was then spontaneously expressed from the uterus and the uterus cleared of all clots and debris with moist lap sponge.  The uterine incision was then repaired in 2 layers:  the first layer was a running locked layer 0 chromic and the second an imbricating layer of the same suture. An aberrant vessel was noted to be bleeding in the mid upper aspect of the uterus - this was suture ligated with great hemostasis.  The tubes and ovaries were inspected and the gutters cleared of all clots and debris. The uterine incision was inspected again and found to be hemostatic. All instruments and sponges as well as the Alexis retractor were then removed from the abdomen. The  peritoneum was then reapproximated in a running fashion and the rectus muscles included The fascia was then closed with 0 Vicryl in a running fashion. The skin was closed  with a subcuticular stitch of 4-0 Vicryl on a Keith needle and then reinforced with benzoin and Steri-Strips and a pressure dressing with an abdominal binder. A dose of methergine was also given prior to fascia closure.. At the conclusion of the procedure all instruments and sponge counts were correct. Patient was taken to the recovery room in good condition with her baby accompanying her skin to skin.

## 2020-03-14 LAB — CBC
HCT: 30.8 % — ABNORMAL LOW (ref 36.0–46.0)
Hemoglobin: 9.8 g/dL — ABNORMAL LOW (ref 12.0–15.0)
MCH: 32.1 pg (ref 26.0–34.0)
MCHC: 31.8 g/dL (ref 30.0–36.0)
MCV: 101 fL — ABNORMAL HIGH (ref 80.0–100.0)
Platelets: 203 10*3/uL (ref 150–400)
RBC: 3.05 MIL/uL — ABNORMAL LOW (ref 3.87–5.11)
RDW: 14.6 % (ref 11.5–15.5)
WBC: 20.2 10*3/uL — ABNORMAL HIGH (ref 4.0–10.5)
nRBC: 0 % (ref 0.0–0.2)

## 2020-03-14 MED ORDER — DOCUSATE SODIUM 100 MG PO CAPS
100.0000 mg | ORAL_CAPSULE | Freq: Two times a day (BID) | ORAL | Status: DC
  Administered 2020-03-14 – 2020-03-16 (×5): 100 mg via ORAL
  Filled 2020-03-14 (×5): qty 1

## 2020-03-14 MED ORDER — FERROUS SULFATE 325 (65 FE) MG PO TABS
325.0000 mg | ORAL_TABLET | Freq: Two times a day (BID) | ORAL | Status: DC
  Administered 2020-03-14 – 2020-03-16 (×4): 325 mg via ORAL
  Filled 2020-03-14 (×4): qty 1

## 2020-03-14 NOTE — Lactation Note (Signed)
This note was copied from a baby's chart. Lactation Consultation Note Baby 54 hrs old., since he spit up he started cueing., Assisted in football position to help w/optimal breathing since nasal congestion. Baby latched well., discussed body alignment and support. Mom encouraged to feed baby 8-12 times/24 hours and with feeding cues. Mom shown how to use DEBP & how to disassemble, clean, & reassemble parts. Mom knows to pump q3h for 15-20 min.  Additional BF teaching and breast massage again taught.  Patient Name: Rachel Hanson [id number] Date: 03/14/2020 Reason for consult: Mother's request;Primapara;Term   Maternal Data Has patient been taught Hand Expression?: Yes Does the patient have breastfeeding experience prior to this delivery?: No  Feeding Feeding Type: Breast Fed  LATCH Score Latch: Grasps breast easily, tongue down, lips flanged, rhythmical sucking.  Audible Swallowing: A few with stimulation  Type of Nipple: Everted at rest and after stimulation  Comfort (Breast/Nipple): Soft / non-tender  Hold (Positioning): Assistance needed to correctly position infant at breast and maintain latch.  LATCH Score: 8  Interventions Interventions: Breast feeding basics reviewed;Support pillows;Assisted with latch;Position options;Skin to skin;Breast massage;Hand express;Breast compression;Adjust position;DEBP  Lactation Tools Discussed/Used Tools: Pump Breast pump type: Double-Electric Breast Pump WIC Program: Yes Pump Review: Setup, frequency, and cleaning;Milk Storage Initiated by:: L., [name] RN IBCLC Date initiated:: 03/14/20   Consult Status Consult Status: Follow-up Date: 03/15/20 Follow-up type: In-patient    Theodoro Kalata 03/14/2020, 3:29 AM

## 2020-03-14 NOTE — Progress Notes (Signed)
POSTPARTUM POSTOP PROGRESS NOTE  POD #1  Subjective:  No acute events overnight. Patient noted some dizziness last night on POD#0 but denies this while sitting up in room this morning. She denies nausea or vomiting.  Pain is well controlled.  She has not had flatus. She has not had bowel movement.  Lochia Minimal. Many questions this morning regarding circumcision. Reviewed it is an elective procedure. Husband on phone, asks questions about possible psychological trauma and hindrance of sexual maturity in the future.Stressed again that this is an elective procedure Baby needs to eat better at this time so, if they desire, circ would be tomorrow. Peds physician in room during conversation, agrees with above  Objective: Blood pressure 102/62, pulse (!) 50, temperature 98.8 F (37.1 C), temperature source Oral, resp. rate 18, height 5' 2"  (1.575 m), weight 67.1 kg, last menstrual period 06/13/2019, SpO2 99 %, unknown if currently breastfeeding.  Physical Exam:  General: alert, cooperative and no distress Lochia:normal flow Chest: CTAB Heart: RRR no m/r/g Abdomen: +BS, soft, nontender Uterine Fundus: Abdominal binder in place. FUndus firm, 2cm below umbilicus. Honeycomb dressing intact, neg drainage Extremities: neg edema, neg calf TTP BL, neg Homans BL  Recent Labs    03/13/20 0110 03/14/20 0604  HGB 12.0 9.8*  HCT 37.1 30.8*    Assessment/Plan:  ASSESSMENT: Rachel Hanson is a 34 y.o. G1P1001 s/p PLTCS @ 66w1dfor suspected HCV genital lesions. PNC c/b ulcerative colitis w/ anal fissure, smoking, AMA.   Plan for discharge tomorrow, Breastfeeding and Lactation consult  Will discuss with husband further regarding circumcision Anemia - 2/2 acute blood loss. Fe/Colace today.  UOP appropriate, may DC Foley this AM   LOS: 1 day

## 2020-03-14 NOTE — Lactation Note (Signed)
This note was copied from a baby's chart. Lactation Consultation Note Baby is 27 hrs old. Baby has nasal congestion. Sounds very stuff. Noisey breathing while awake. LC changed pasty stool mec., diaper. Encouraged to apply petroleum jelly yo buttock and scrotum when changing diaper. Baby spitty. Hand expression taught spoon fed a few drops of colostrum. Not long after that baby has very large thick mucous emesis. LC used bulb syring to clear mouth., gave baby to mom to hold up right., Discussed newborn behavior, STS, I&O, newborn feeding habits, breast massage, milk storage, supply and demand.  Gave mom information sheet on supplementing d/t baby BW 6.0lbs  DEBP set up for mom to start pumping in am then hand express to collect colostrum to give back to baby. Mentioned to mom Donor milk available if needed.  Mom wants to do Breast/bottle.  Mom has long everted nipples and had breast changes w/pregnancy. Encouraged mom to call for assistance or questions. Lactation brochure given.   Patient Name: Boy Kirsi Hugh [id number] Date: 03/14/2020 Reason for consult: Initial assessment;Primapara;Term   Maternal Data Has patient been taught Hand Expression?: Yes Does the patient have breastfeeding experience prior to this delivery?: No  Feeding Feeding Type: Breast Milk  LATCH Score Latch: Too sleepy or reluctant, no latch achieved, no sucking elicited.  Audible Swallowing: None  Type of Nipple: Everted at rest and after stimulation  Comfort (Breast/Nipple): Soft / non-tender  Hold (Positioning): Assistance needed to correctly position infant at breast and maintain latch.  LATCH Score: 7  Interventions Interventions: Breast feeding basics reviewed;Support pillows;Position options;Skin to skin;Expressed milk;Breast massage;Hand express;Breast compression  Lactation Tools Discussed/Used WIC Program: Yes   Consult Status Consult Status: Follow-up Date: 03/14/20 (in pm) Follow-up  type: In-patient    Kameran Mcneese, Elta [place] 03/14/2020, 3:01 AM

## 2020-03-15 NOTE — Progress Notes (Signed)
POD #2 LTCS Doing well, sore but ok Afeb, VSS Abd- soft, fundus firm, pressure dressing in place Continue routine care, ambulate, remove pressure dressing

## 2020-03-15 NOTE — Lactation Note (Addendum)
This note was copied from a baby's chart. Lactation Consultation Note  Patient Name: Rachel Hanson [id number] Date: 03/15/2020  [name] sleeping on arrival. Now 56 hours old.  Mom reports she just topped him off with formula past breastfeeding.   Mom topless and reports she was fixing to put coconut oil on her nipples.  Urged mom to hand express and rub Expressed Mothers milk on nipples and air dry. Assisted with hand expression and patting Expressed mothers milk on nipples and air dry.  Dad on phone with mom and asked about pumping.  Wanted to know when they could start pumping and he could give breastmilk instead of formula.Mom reports it was set up or her but she did not ever start using it. Explained they could go ahead and start now. Initiated pumping with mom.  Used 24 mm flanges and at this time they fit well.  Urged mom to pump everytime gives a bottle.  Moms nipples very tender but intact.  Left mom pumping on her own last few minutes.  Praised breastfeeding efforts.  Urged her to always hand express pat her milk on them and air dry fiost and then the coconut oil.   Urged mom to call lactation as needed.   Maternal Data    Feeding    LATCH Score                   Interventions    Lactation Tools Discussed/Used     Consult Status      Rachel Hanson 03/15/2020, 3:02 PM

## 2020-03-15 NOTE — Progress Notes (Signed)
CSW received consult due to score 11 on Edinburgh Depression Screen.    CSW congratulated MOB on the birth of infant. CSW advised MOB of CSW's role and the reason. MOB reported that over the past 7 days she has been "getting ready the baby". MOB reported that she was also in the process of planning surprise party for fiance "and then we were told that we would be being induced". MOB reported that she was advised that induction would be on June 29 and then was advised that induction would be June 20. MOB reported that "this increased my anxiety because I was thinking I had 9 days to prepare and then it turned into three". CSW validated MOB's feelings and how this can increase anxiety. MOB reported that she has a hx of anxiety, however no clinical diagnoses has been given. MOB reported that she has been feeling "good" since she gave birth. MOB reported no desire for medication resources of therapy resources at this time as MOB reported "I can look them up if I feel that I need them". CSW understanding and was advised that MOB is not feeling SI or HI and isnt involved in any DV. MOB reported no other mental health hx.   CSW was notified that MOB has support from her fiance as well as other family. MOB reported that she has all needed items to care for infant with no other needs.   CSW provided education regarding Baby Blues vs PMADs.  CSW encouraged MOB to evaluate her mental health throughout the postpartum period with the use of the New Mom Checklist developed by [facility] [employer] as well as the [place] Postnatal Depression Scale and notify a medical professional if symptoms arise.    At this time MOB reported no other concerns to this CSW, no barriers to discharge.     Virgie Dad Jazmynn Pho, MSW, LCSW [facility] [phone]

## 2020-03-16 MED ORDER — PRENATAL 27-0.8 MG PO TABS: 2.0000 | ORAL_TABLET | Freq: Every day | ORAL | 3 refills | Status: DC

## 2020-03-16 MED ORDER — IBUPROFEN 600 MG PO TABS
600.0000 mg | ORAL_TABLET | Freq: Four times a day (QID) | ORAL | 1 refills | Status: DC | PRN
Start: 2020-03-16 — End: 2023-08-11

## 2020-03-16 MED ORDER — OXYCODONE HCL 5 MG PO TABS: 5.0000 mg | ORAL_TABLET | Freq: Four times a day (QID) | ORAL | 0 refills | Status: DC | PRN

## 2020-03-16 NOTE — Progress Notes (Addendum)
Subjective: Postpartum Day 3: Cesarean Delivery Patient reports incisional pain, tolerating PO and no problems voiding.    Objective: Vital signs in last 24 hours: Temp:  [98 F (36.7 C)-98.6 F (37 C)] 98 F (36.7 C) (06/24 0504) Pulse Rate:  [63-83] 78 (06/24 0504) Resp:  [16-18] 16 (06/24 0504) BP: (97-105)/(63-72) 97/63 (06/24 0504) SpO2:  [100 %] 100 % (06/23 2150)  Physical Exam:  General: alert and no distress Lochia: appropriate Uterine Fundus: firm Incision: healing well DVT Evaluation: No evidence of DVT seen on physical exam.  Recent Labs    03/14/20 0604  HGB 9.8*  HCT 30.8*    Assessment/Plan: Status post Cesarean section. Doing well postoperatively.  Discharge home with standard precautions and return to clinic in 2 and 6 weeks.  D/C with motrin, percocet/oxycodone and PNV.    Marriah Sanderlin Bovard-Stuckert 03/16/2020, 8:13 AM

## 2020-03-16 NOTE — Discharge Summary (Signed)
Postpartum Discharge Summary     Patient Name: Rachel Hanson DOB: October 28, 1985 MRN: [id number]  Date of admission: 03/13/2020 Delivery date:03/13/2020  Delivering provider: Sherlyn Hay  Date of discharge: 03/16/2020  Admitting diagnosis: Indication for care in labor or delivery [O75.9] Intrauterine pregnancy: [redacted]w[redacted]d    Secondary diagnosis:  Active Problems:   Indication for care in labor or delivery   Status post primary low transverse cesarean section  Additional problems:questionabl herpes outbreak   Discharge diagnosis: Term Pregnancy Delivered                                              Post partum procedures:N/A Augmentation: Cytotec Complications: None  Hospital course: Induction of Labor With Vaginal Delivery   34y.o. yo G1P1001 at 350w1das admitted to the hospital 03/13/2020 for induction of labor.  Indication for induction: Favorable cervix at term.  Patient had a complicated labor course as follows: questionable herpes outbreak - so proceeded with LTCS Membrane Rupture Time/Date: 5:31 PM ,03/13/2020   Delivery Method:C-Section, Low Transverse  Episiotomy: None  Lacerations:  None  Details of delivery can be found in separate delivery note.  Patient had a routine postpartum course. Patient is discharged home 03/16/20.  Newborn Data: Birth date:03/13/2020  Birth time:5:32 PM  Gender:Female  Living status:Living  Apgars:8 ,9  Weight:2725 g   Magnesium Sulfate received: No BMZ received: No Rhophylac:No MMR:No T-DaP:Given prenatally Flu: No Transfusion:No  Physical exam  Vitals:   03/15/20 0510 03/15/20 1400 03/15/20 2150 03/16/20 0504  BP: 93/62 97/69 105/72 97/63  Pulse: 67 63 83 78  Resp: 17 18 18 16   Temp: 98.2 F (36.8 C) 98.6 F (37 C) 98.3 F (36.8 C) 98 F (36.7 C)  TempSrc: Axillary Axillary Oral Axillary  SpO2: 99%  100%   Weight:      Height:       General: alert and no distress Lochia: appropriate Uterine Fundus:  firm Incision: Healing well with no significant drainage DVT Evaluation: No evidence of DVT seen on physical exam. Labs: Lab Results  Component Value Date   WBC 20.2 (H) 03/14/2020   HGB 9.8 (L) 03/14/2020   HCT 30.8 (L) 03/14/2020   MCV 101.0 (H) 03/14/2020   PLT 203 03/14/2020   No flowsheet data found. Edinburgh Score: Edinburgh Postnatal Depression Scale Screening Tool 03/15/2020  I have been able to laugh and see the funny side of things. 0  I have looked forward with enjoyment to things. 0  I have blamed myself unnecessarily when things went wrong. 2  I have been anxious or worried for no good reason. 2  I have felt scared or panicky for no good reason. 1  Things have been getting on top of me. 3  I have been so unhappy that I have had difficulty sleeping. 1  I have felt sad or miserable. 1  I have been so unhappy that I have been crying. 1  The thought of harming myself has occurred to me. 0  Edinburgh Postnatal Depression Scale Total 11      After visit meds:  Allergies as of 03/16/2020      Reactions   Bactrim [sulfamethoxazole-trimethoprim]    2 episodes of facial swelling, rash following initiation of Bactrim      Medication List    TAKE these medications  acetaminophen 325 MG tablet Commonly known as: Tylenol Take 2 tablets (650 mg total) by mouth every 6 (six) hours as needed for moderate pain or fever.   AMBULATORY NON FORMULARY MEDICATION Medication Name: Nitroglycerin 0.125 ointment three times a day apply rectally up until the first knuckle after fingernail for 6-8 weeks.   ibuprofen 600 MG tablet Commonly known as: ADVIL Take 1 tablet (600 mg total) by mouth every 6 (six) hours as needed for moderate pain.   multivitamin-prenatal 27-0.8 MG Tabs tablet Take 2 tablets by mouth daily at 12 noon.   oxyCODONE 5 MG immediate release tablet Commonly known as: Oxy IR/ROXICODONE Take 1-2 tablets (5-10 mg total) by mouth every 6 (six) hours as needed for  moderate pain or severe pain.        Discharge home in stable condition Infant Feeding: Breast Infant Disposition:home with mother Discharge instruction: per After Visit Summary and Postpartum booklet. Activity: Advance as tolerated. Pelvic rest for 6 weeks.  Diet: routine diet Anticipated Birth Control: Unsure Postpartum Appointment:2 and 6 weeks Additional Postpartum F/U: N/A Future Appointments:No future appointments. Follow up Visit:  [place], [place], DO. Schedule an appointment as soon as possible for a visit in 2 week(s).   Specialty: Obstetrics and Gynecology Why: for incision check and 6 weeks for full postpartum check Contact information: [place] [id number] [phone]                   03/16/2020 Janyth Contes, MD

## 2020-03-16 NOTE — Lactation Note (Signed)
This note was copied from a baby's chart. Lactation Consultation Note  Patient Name: Rachel Hanson [id number] Date: 03/16/2020   Infant is 52 hrs old. Infant has been going to the breast & receiving formula with a slow-flow nipple after feedings. Infant gained 10 g overnight. When I entered room, Mom commented that her breasts felt "hard." Mom's milk has come to volume.  She is not engorged, but she is full. Infant was cueing so I helped Mom to latch infant. Swallows were immediately noted & her other breast began leaking.   Mom has some initial discomfort with latch & some intermittent tenderness during the feedings. Infant's latch looked good; her nipples were intact.   Dad is interested in having Mom pump so that she can get sleep & he can bottle feed infant when Mom is sleeping. I talked to parents about feeding from breast & pumping for comfort (not to be empty) if she becomes uncomfortably full. I showed Dad how to use, clean, & assemble the hand pump. Mom needs a size 27 flange. Pumping was done with Mom's L breast while she was feeding from her R breast (it was leaking, see above), but she was not expressing as much as to be expected. Parents said they are going to purchase a DEBP.   Parents know how to reach [place] for post-discharge questions.   Matthias Hughs [facility] 03/16/2020, 10:30 AM

## 2020-03-18 ENCOUNTER — Telehealth (HOSPITAL_COMMUNITY): Payer: Self-pay | Admitting: Lactation Services

## 2020-03-18 NOTE — Telephone Encounter (Signed)
Return phone call - per mom baby is 27 days old , and milk was coming in prior to d/c from the hospital. Per mom has been back for a weight check the day after the D/C and baby was gaining. Mom expressed concerns -baby was latching and this am was not latching as well, had been crying prior to latching.  LC recommended prior to latch, especially if the baby has been crying to burp the baby before / in between and after the feeding and then latch and work on the depth.  Mom mentioned she has large nipples and wasn't sure if the baby is always latching deeply due to her large nipple.  LC recommended breast massage, hand express, pre-pump with hand pump if needed and reverse pressure ( per mom familiar with reverse pressure ) and latch.  Mom mentioned the middle of her nipple ( small area ) is sometimes sore.  LC recommended using her EBM liberally to her nipples, and if using coconut oil use it sparely.  Mom denies issues with engorgement.  Mom mentioned baby is feeding sometimes hourly and then stretching it longer. Plenty of wets and stools.  LC reviewed feeding behaviors of a term newborn in the 1st weeks of breast feeding / growth spurts.  LC recommended if she has further questions to call [facility] office back.

## 2020-03-22 ENCOUNTER — Inpatient Hospital Stay (HOSPITAL_COMMUNITY): Payer: Medicaid Other

## 2020-03-23 DIAGNOSIS — Z419 Encounter for procedure for purposes other than remedying health state, unspecified: Secondary | ICD-10-CM | POA: Diagnosis not present

## 2020-03-28 DIAGNOSIS — B009 Herpesviral infection, unspecified: Secondary | ICD-10-CM | POA: Diagnosis not present

## 2020-04-23 DIAGNOSIS — Z419 Encounter for procedure for purposes other than remedying health state, unspecified: Secondary | ICD-10-CM | POA: Diagnosis not present

## 2020-04-25 DIAGNOSIS — Z1389 Encounter for screening for other disorder: Secondary | ICD-10-CM | POA: Diagnosis not present

## 2020-04-25 DIAGNOSIS — Z3009 Encounter for other general counseling and advice on contraception: Secondary | ICD-10-CM | POA: Diagnosis not present

## 2020-05-24 DIAGNOSIS — Z419 Encounter for procedure for purposes other than remedying health state, unspecified: Secondary | ICD-10-CM | POA: Diagnosis not present

## 2020-06-01 DIAGNOSIS — Z3009 Encounter for other general counseling and advice on contraception: Secondary | ICD-10-CM | POA: Diagnosis not present

## 2020-06-01 DIAGNOSIS — F418 Other specified anxiety disorders: Secondary | ICD-10-CM | POA: Diagnosis not present

## 2020-06-16 DIAGNOSIS — F411 Generalized anxiety disorder: Secondary | ICD-10-CM | POA: Diagnosis not present

## 2020-06-23 DIAGNOSIS — Z419 Encounter for procedure for purposes other than remedying health state, unspecified: Secondary | ICD-10-CM | POA: Diagnosis not present

## 2020-07-24 DIAGNOSIS — Z419 Encounter for procedure for purposes other than remedying health state, unspecified: Secondary | ICD-10-CM | POA: Diagnosis not present

## 2020-08-23 DIAGNOSIS — Z419 Encounter for procedure for purposes other than remedying health state, unspecified: Secondary | ICD-10-CM | POA: Diagnosis not present

## 2020-09-23 DIAGNOSIS — Z419 Encounter for procedure for purposes other than remedying health state, unspecified: Secondary | ICD-10-CM | POA: Diagnosis not present

## 2020-10-24 DIAGNOSIS — Z419 Encounter for procedure for purposes other than remedying health state, unspecified: Secondary | ICD-10-CM | POA: Diagnosis not present

## 2020-11-21 DIAGNOSIS — Z419 Encounter for procedure for purposes other than remedying health state, unspecified: Secondary | ICD-10-CM | POA: Diagnosis not present

## 2020-12-15 DIAGNOSIS — E669 Obesity, unspecified: Secondary | ICD-10-CM | POA: Diagnosis not present

## 2020-12-15 DIAGNOSIS — Z01419 Encounter for gynecological examination (general) (routine) without abnormal findings: Secondary | ICD-10-CM | POA: Diagnosis not present

## 2020-12-15 DIAGNOSIS — Z1389 Encounter for screening for other disorder: Secondary | ICD-10-CM | POA: Diagnosis not present

## 2020-12-15 DIAGNOSIS — Z130 Encounter for screening for diseases of the blood and blood-forming organs and certain disorders involving the immune mechanism: Secondary | ICD-10-CM | POA: Diagnosis not present

## 2020-12-22 DIAGNOSIS — Z419 Encounter for procedure for purposes other than remedying health state, unspecified: Secondary | ICD-10-CM | POA: Diagnosis not present

## 2021-01-21 DIAGNOSIS — Z419 Encounter for procedure for purposes other than remedying health state, unspecified: Secondary | ICD-10-CM | POA: Diagnosis not present

## 2021-02-21 DIAGNOSIS — Z419 Encounter for procedure for purposes other than remedying health state, unspecified: Secondary | ICD-10-CM | POA: Diagnosis not present

## 2021-03-23 DIAGNOSIS — Z419 Encounter for procedure for purposes other than remedying health state, unspecified: Secondary | ICD-10-CM | POA: Diagnosis not present

## 2021-04-23 DIAGNOSIS — Z419 Encounter for procedure for purposes other than remedying health state, unspecified: Secondary | ICD-10-CM | POA: Diagnosis not present

## 2021-05-24 DIAGNOSIS — Z419 Encounter for procedure for purposes other than remedying health state, unspecified: Secondary | ICD-10-CM | POA: Diagnosis not present

## 2021-06-23 ENCOUNTER — Emergency Department (HOSPITAL_COMMUNITY): Payer: 59

## 2021-06-23 ENCOUNTER — Encounter (HOSPITAL_COMMUNITY): Payer: Self-pay | Admitting: *Deleted

## 2021-06-23 ENCOUNTER — Other Ambulatory Visit: Payer: Self-pay

## 2021-06-23 ENCOUNTER — Emergency Department (HOSPITAL_COMMUNITY)
Admission: EM | Admit: 2021-06-23 | Discharge: 2021-06-24 | Disposition: A | Discharge status: Home / Self Care | Payer: 59 | Attending: Emergency Medicine | Admitting: Emergency Medicine

## 2021-06-23 DIAGNOSIS — R1011 Right upper quadrant pain: Secondary | ICD-10-CM | POA: Diagnosis not present

## 2021-06-23 DIAGNOSIS — F17210 Nicotine dependence, cigarettes, uncomplicated: Secondary | ICD-10-CM | POA: Diagnosis not present

## 2021-06-23 DIAGNOSIS — R519 Headache, unspecified: Secondary | ICD-10-CM | POA: Diagnosis not present

## 2021-06-23 DIAGNOSIS — Z20822 Contact with and (suspected) exposure to covid-19: Secondary | ICD-10-CM | POA: Diagnosis not present

## 2021-06-23 DIAGNOSIS — R5383 Other fatigue: Secondary | ICD-10-CM | POA: Diagnosis not present

## 2021-06-23 DIAGNOSIS — Z8616 Personal history of COVID-19: Secondary | ICD-10-CM | POA: Insufficient documentation

## 2021-06-23 DIAGNOSIS — Z419 Encounter for procedure for purposes other than remedying health state, unspecified: Secondary | ICD-10-CM | POA: Diagnosis not present

## 2021-06-23 DIAGNOSIS — R29818 Other symptoms and signs involving the nervous system: Secondary | ICD-10-CM | POA: Diagnosis not present

## 2021-06-23 DIAGNOSIS — R110 Nausea: Secondary | ICD-10-CM | POA: Insufficient documentation

## 2021-06-23 DIAGNOSIS — R0789 Other chest pain: Secondary | ICD-10-CM | POA: Diagnosis not present

## 2021-06-23 DIAGNOSIS — R1013 Epigastric pain: Secondary | ICD-10-CM | POA: Diagnosis not present

## 2021-06-23 DIAGNOSIS — R42 Dizziness and giddiness: Secondary | ICD-10-CM | POA: Diagnosis not present

## 2021-06-23 LAB — BASIC METABOLIC PANEL
Anion gap: 8 (ref 5–15)
BUN: 11 mg/dL (ref 6–20)
CO2: 24 mmol/L (ref 22–32)
Calcium: 9.4 mg/dL (ref 8.9–10.3)
Chloride: 107 mmol/L (ref 98–111)
Creatinine, Ser: 0.71 mg/dL (ref 0.44–1.00)
GFR, Estimated: >60 mL/min (ref >60)
Glucose, Bld: 96 mg/dL (ref 70–99)
Potassium: 4 mmol/L (ref 3.5–5.1)
Sodium: 139 mmol/L (ref 135–145)

## 2021-06-23 LAB — CBC
HCT: 42.9 % (ref 36.0–46.0)
Hemoglobin: 13.6 g/dL (ref 12.0–15.0)
MCH: 31.4 pg (ref 26.0–34.0)
MCHC: 31.7 g/dL (ref 30.0–36.0)
MCV: 99.1 fL (ref 80.0–100.0)
Platelets: 306 10*3/uL (ref 150–400)
RBC: 4.33 MIL/uL (ref 3.87–5.11)
RDW: 12.7 % (ref 11.5–15.5)
WBC: 6.1 10*3/uL (ref 4.0–10.5)
nRBC: 0 % (ref 0.0–0.2)

## 2021-06-23 LAB — I-STAT BETA HCG BLOOD, ED (MC, WL, AP ONLY): I-stat hCG, quantitative: <5 m[IU]/mL (ref <5)

## 2021-06-23 LAB — TROPONIN I (HIGH SENSITIVITY): Troponin I (High Sensitivity): <2 ng/L (ref <18)

## 2021-06-23 IMAGING — CR DG CHEST 2V
2 series · 2 of 2 positions shown · non-contrast
Comparison: None.

CLINICAL DATA: Dizziness and chest discomfort for 3 days.

EXAM:
CHEST - 2 VIEW

[chest pa]
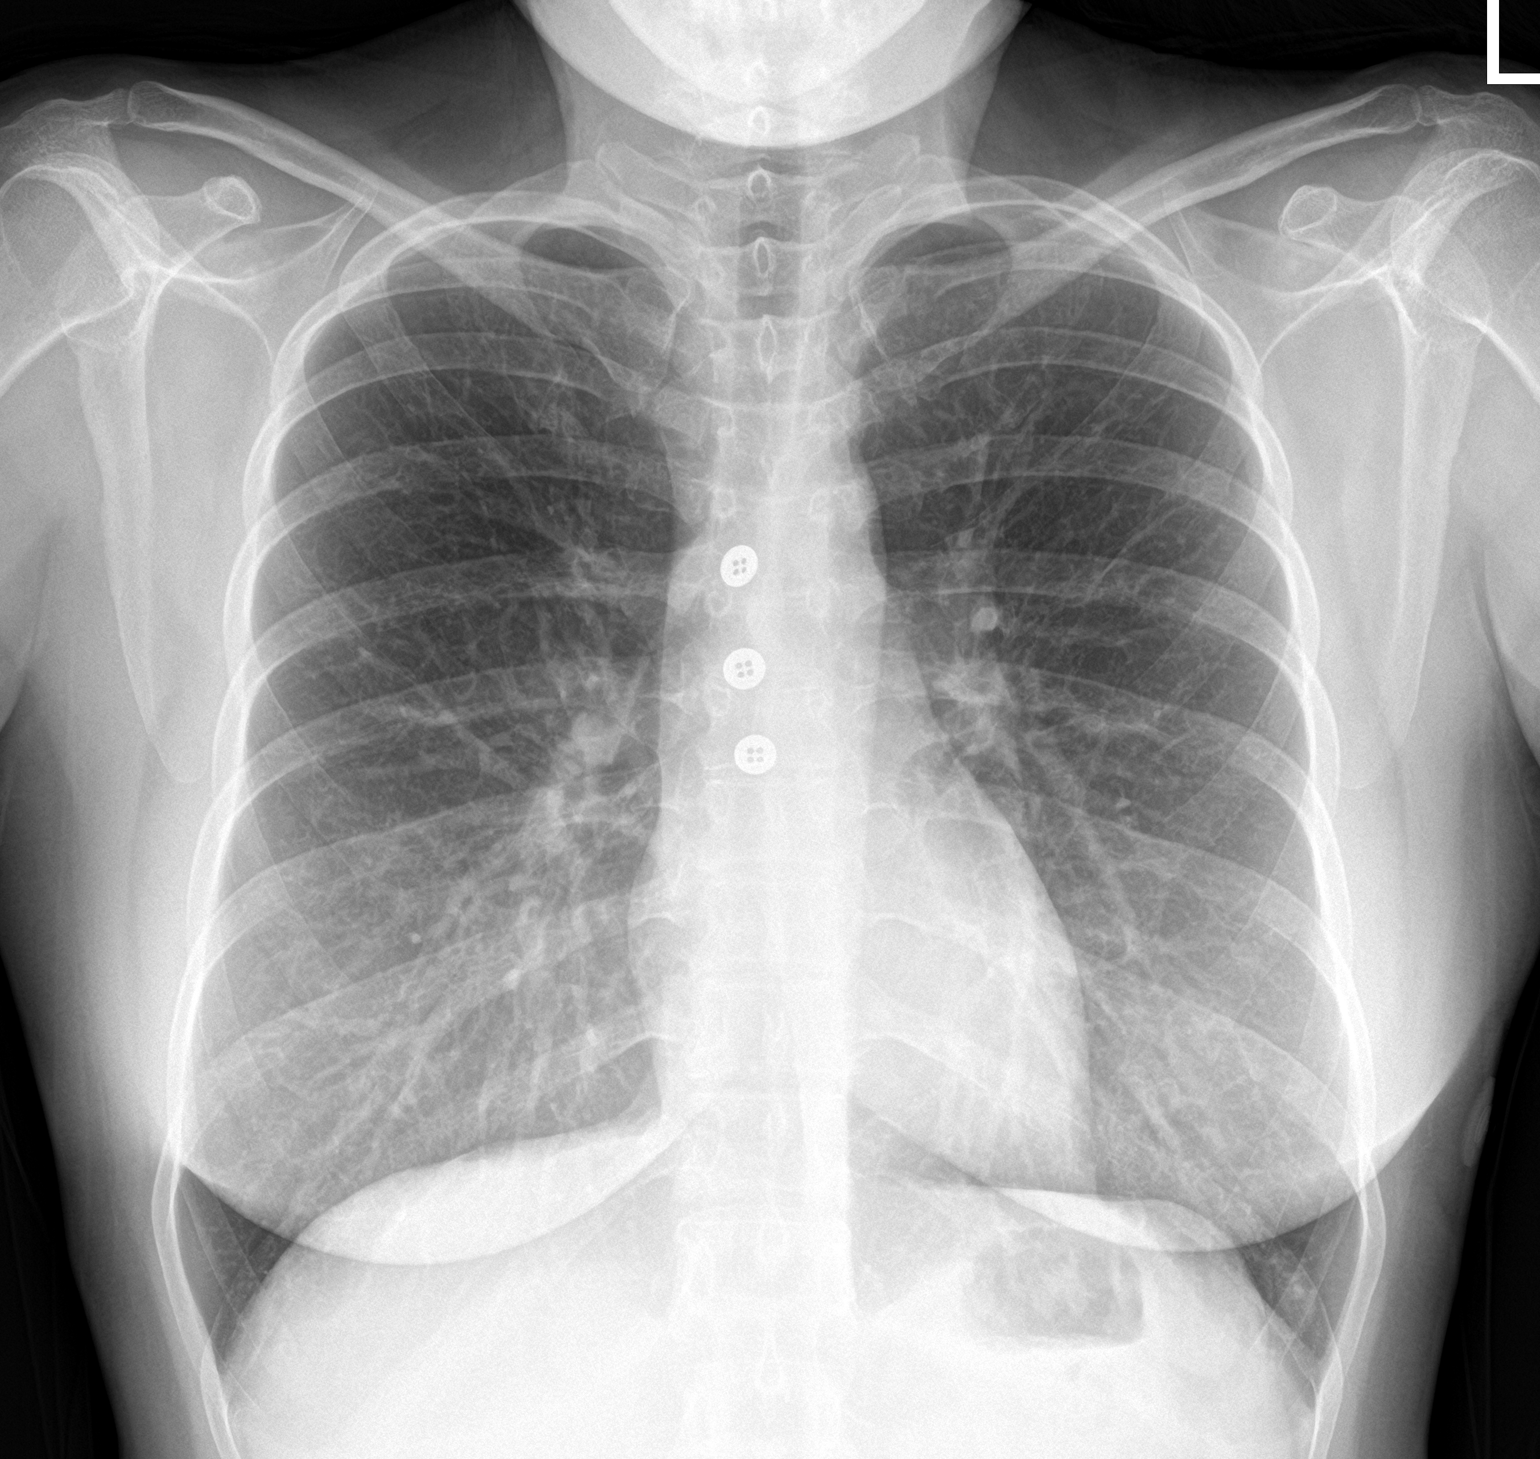

[chest lat]
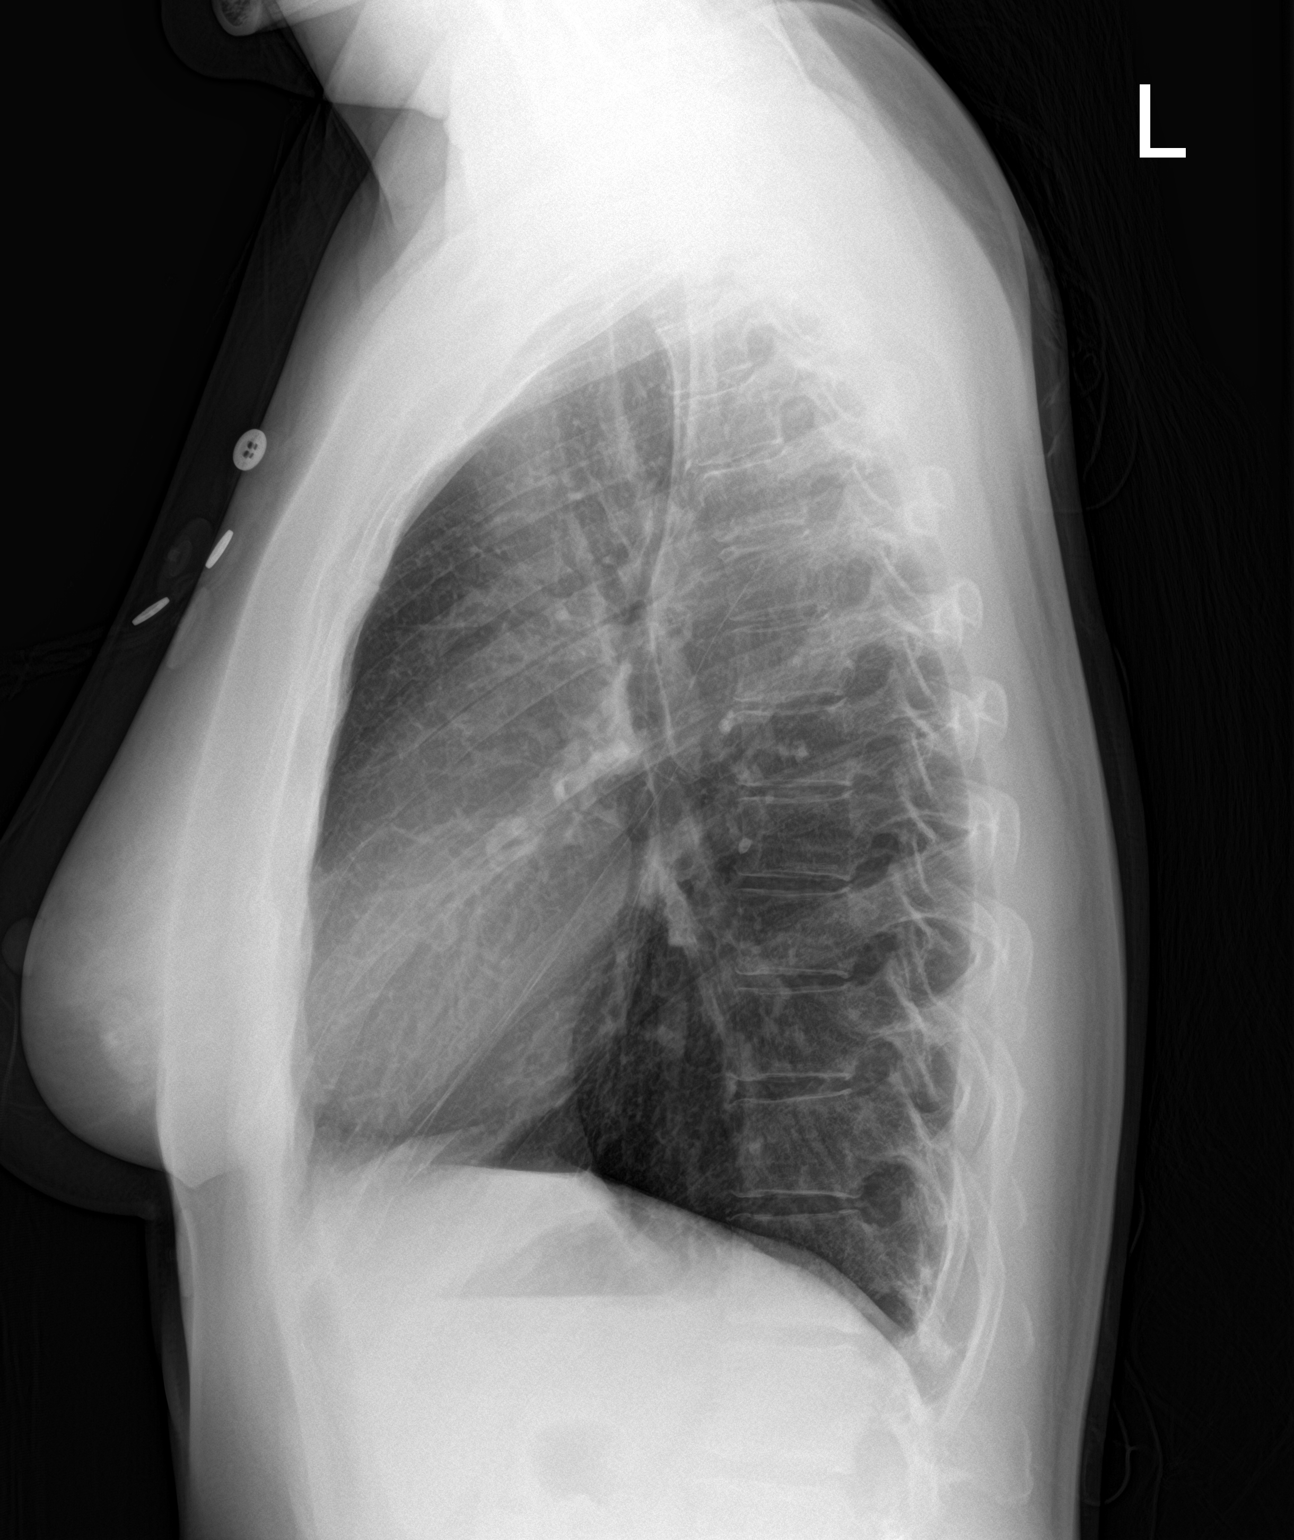

[2 of 2 positions shown; findings below may reference images not displayed]

FINDINGS: The heart size and mediastinal contours are within normal limits.
Both lungs are clear. The visualized skeletal structures are
unremarkable.
IMPRESSION: Normal exam.

## 2021-06-23 NOTE — ED Triage Notes (Signed)
The pt is c/o dizziness and chest tightness since thrusday hx of anxiety in the past  lmpon birth control irregular

## 2021-06-24 ENCOUNTER — Emergency Department (HOSPITAL_COMMUNITY): Payer: 59

## 2021-06-24 DIAGNOSIS — R519 Headache, unspecified: Secondary | ICD-10-CM | POA: Diagnosis not present

## 2021-06-24 DIAGNOSIS — R29818 Other symptoms and signs involving the nervous system: Secondary | ICD-10-CM | POA: Diagnosis not present

## 2021-06-24 DIAGNOSIS — R42 Dizziness and giddiness: Secondary | ICD-10-CM | POA: Diagnosis not present

## 2021-06-24 LAB — RESP PANEL BY RT-PCR (FLU A&B, COVID) ARPGX2
Influenza A by PCR: NEGATIVE
Influenza B by PCR: NEGATIVE
SARS Coronavirus 2 by RT PCR: NEGATIVE

## 2021-06-24 LAB — TROPONIN I (HIGH SENSITIVITY): Troponin I (High Sensitivity): 3 ng/L (ref <18)

## 2021-06-24 IMAGING — MR MR MRA HEAD W/O CM
2 series · 18 of 48 positions shown · IV contrast (gadavist)
Comparison: No pertinent prior exam.

CLINICAL DATA: Neuro deficit, acute, stroke suspected. Dizziness.
Headache.

EXAM:
MRI HEAD WITHOUT CONTRAST
MRA HEAD WITHOUT CONTRAST
MRA OF THE NECK WITHOUT AND WITH CONTRAST
TECHNIQUE: Multiplanar, multi-echo pulse sequences of the brain and surrounding
structures were acquired without intravenous contrast. Angiographic
images of the Circle of Willis were acquired using MRA technique
without intravenous contrast. Angiographic images of the neck were
acquired using MRA technique without and with intravenous contrast.
Carotid stenosis measurements (when applicable) are obtained
utilizing NASCET criteria, using the distal internal carotid
diameter as the denominator.
CONTRAST:  6.5mL GADAVIST GADOBUTROL 1 MMOL/ML IV SOLN

[Series 4: ax (id) · axial · 1.0mm · 0.43mm/px · z∈[+14,+94]mm · 17 of 176 slices shown]
[im 1/176]
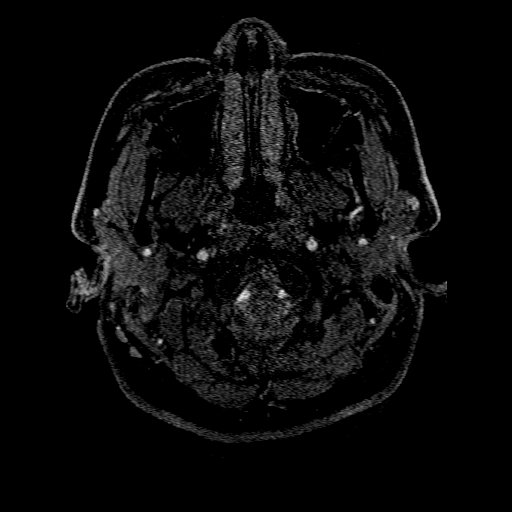
[im 4/176]
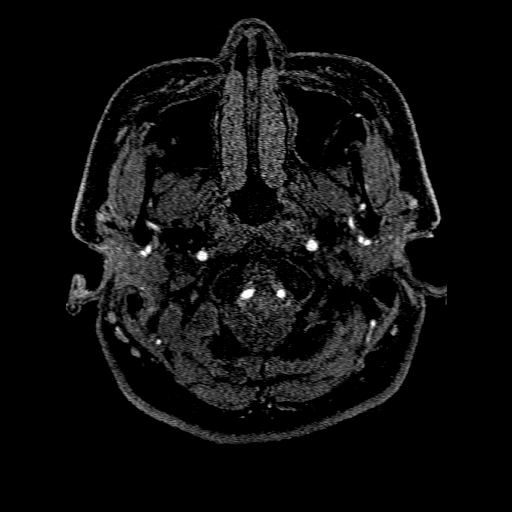
[im 8/176]
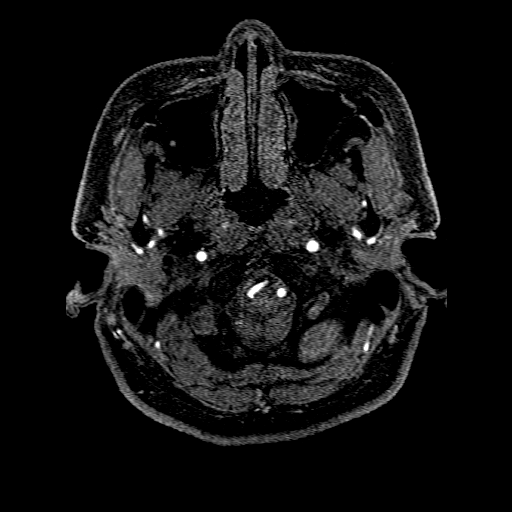
[im 12/176]
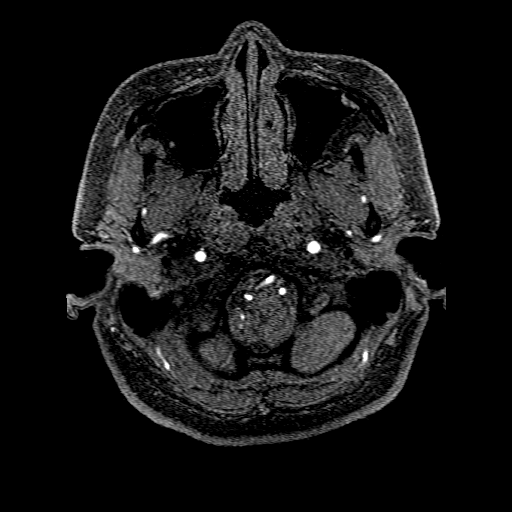
[im 16/176]
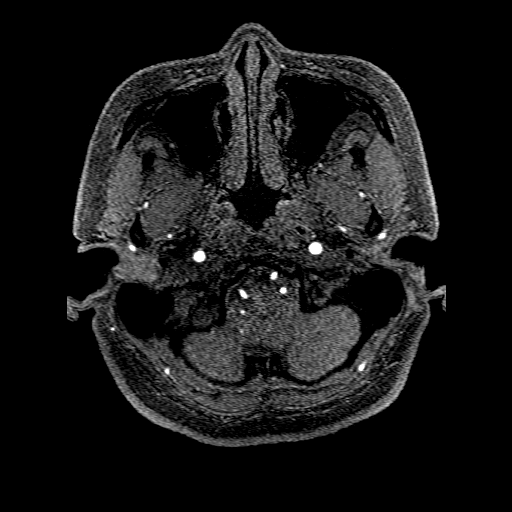
[im 20/176]
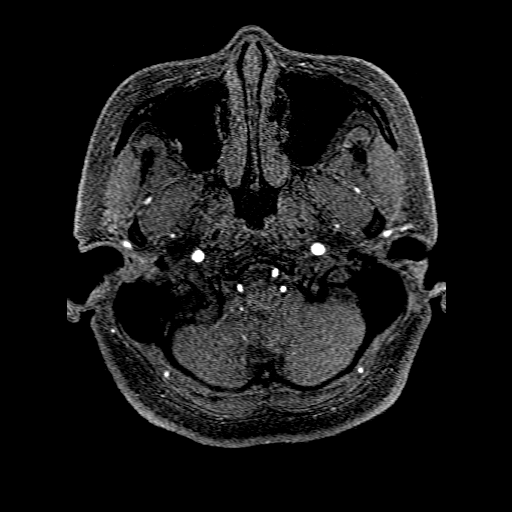
[im 23/176]
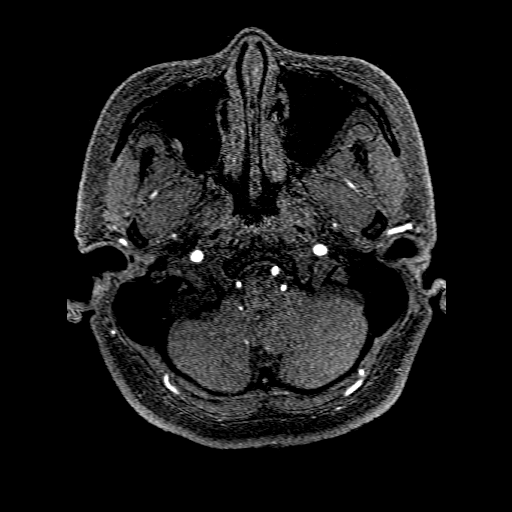
[im 27/176]
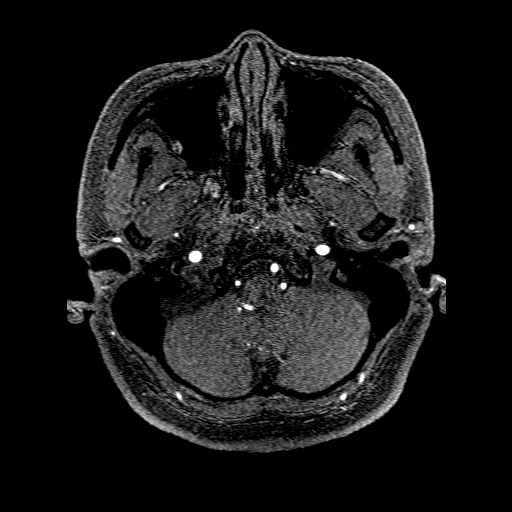
[im 31/176]
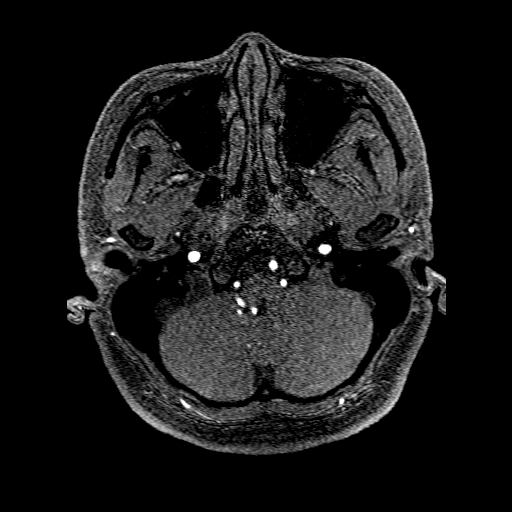
[im 54/176]
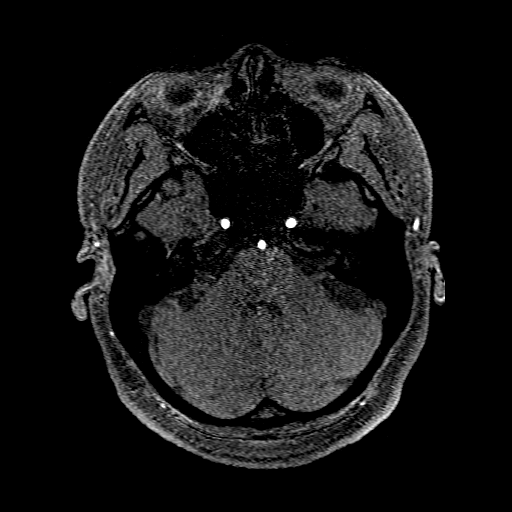
[im 77/176]
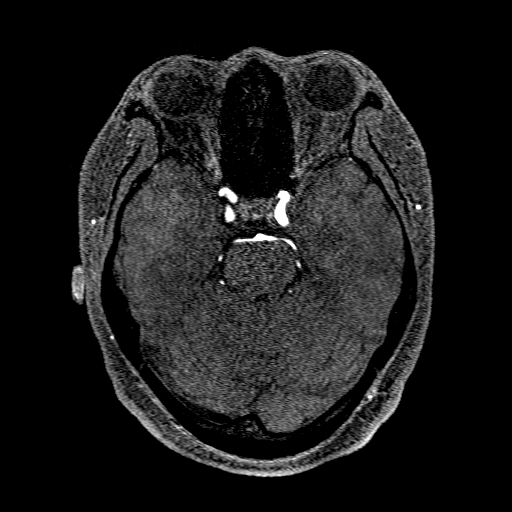
[im 88/176]
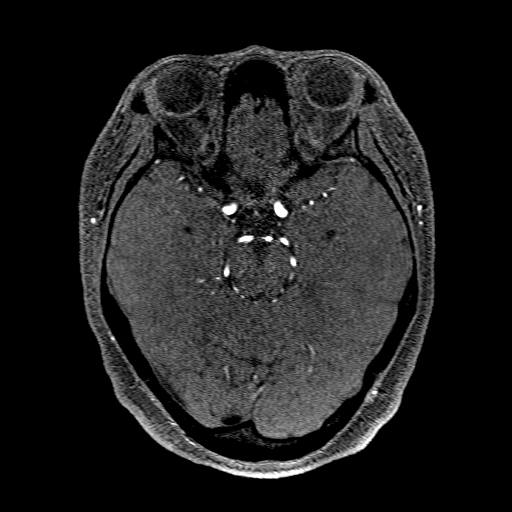
[im 99/176]
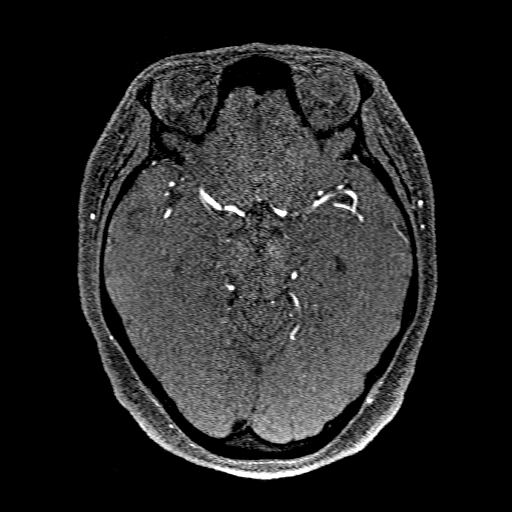
[im 122/176]
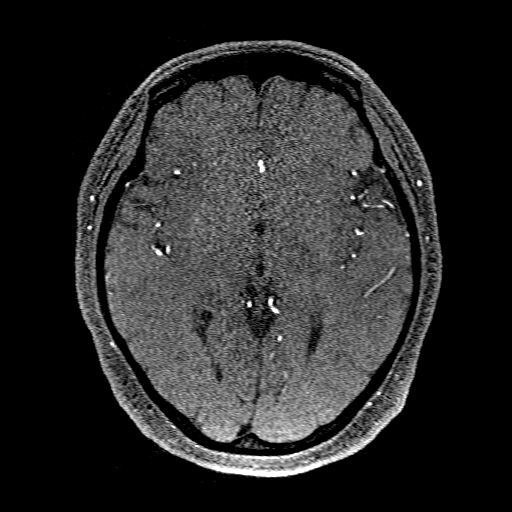
[im 145/176]
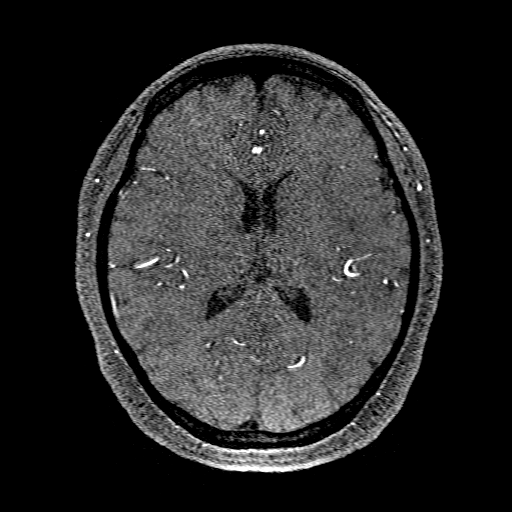
[im 149/176]
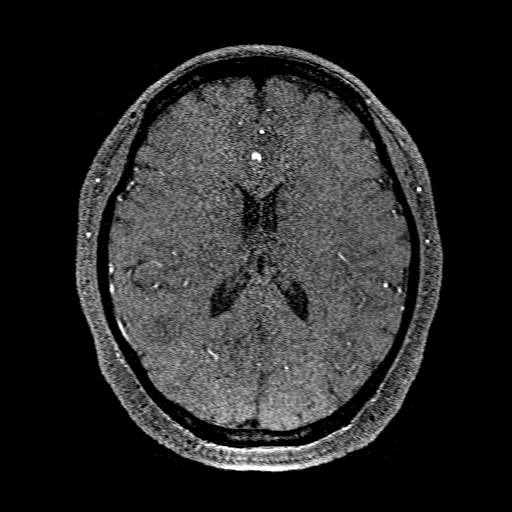
[im 168/176]
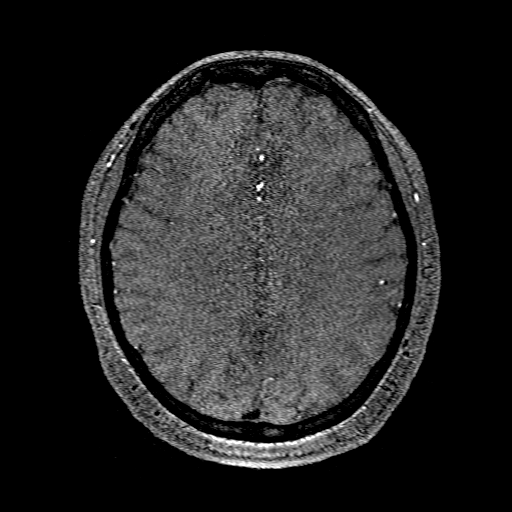

[Series 401: pjn:ax (id) · sagittal · 1.0mm · 0.43mm/px · 1 of 4 slices shown]
[im 1/4]
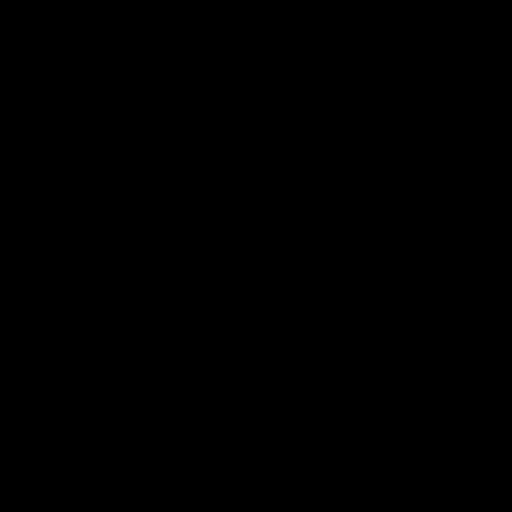

[18 of 48 positions shown; findings below may reference images not displayed]

FINDINGS: MR HEAD FINDINGS

Brain: There is no evidence of an acute infarct, intracranial
hemorrhage, mass, midline shift, or extra-axial fluid collection.
The ventricles and sulci are normal. The brain is normal in signal.
The cerebellar tonsils are normally positioned. The pituitary gland
is normal in size.

Vascular: Major intracranial vascular flow voids are preserved.

Skull and upper cervical spine: Unremarkable bone marrow signal.

Sinuses/Orbits: Unremarkable orbits. Mild scattered mucosal
thickening in the paranasal sinuses. Clear mastoid air cells.

Other: None.

MRA HEAD FINDINGS

Anterior circulation: The internal carotid arteries are widely
patent from skull base to carotid termini. ACAs and MCAs are patent
without evidence of a proximal branch occlusion or significant
proximal stenosis. No aneurysm is identified.

Posterior circulation: The intracranial vertebral arteries are
widely patent to the basilar. The basilar artery is widely patent.
There are small posterior communicating arteries bilaterally. Both
PCAs are patent without evidence of a significant proximal stenosis.
No aneurysm is identified.

Anatomic variants: None.

MRA NECK FINDINGS

Aortic arch: Normal variant 4 vessel aortic arch with the left
vertebral artery arising from the arch. No arch vessel origin
stenosis.

Right carotid system: Patent and smooth without evidence of stenosis
or dissection.

Left carotid system: Patent and smooth without evidence of stenosis
or dissection.

Vertebral arteries: Patent with antegrade flow bilaterally. Mildly
dominant right vertebral artery. No evidence of a significant
stenosis or dissection.
IMPRESSION: 1. Unremarkable appearance of the brain.
2. Negative head MRA.
3. Negative neck MRA.

## 2021-06-24 IMAGING — MR MR MRA NECK WO/W CM
4 of 7 series · 18 of 48 positions shown · IV contrast (6.5 m Gadavist)
Comparison: No pertinent prior exam.

CLINICAL DATA: Neuro deficit, acute, stroke suspected. Dizziness.
Headache.

EXAM:
MRI HEAD WITHOUT CONTRAST
MRA HEAD WITHOUT CONTRAST
MRA OF THE NECK WITHOUT AND WITH CONTRAST
TECHNIQUE: Multiplanar, multi-echo pulse sequences of the brain and surrounding
structures were acquired without intravenous contrast. Angiographic
images of the Circle of Willis were acquired using MRA technique
without intravenous contrast. Angiographic images of the neck were
acquired using MRA technique without and with intravenous contrast.
Carotid stenosis measurements (when applicable) are obtained
utilizing NASCET criteria, using the distal internal carotid
diameter as the denominator.
CONTRAST:  6.5mL GADAVIST GADOBUTROL 1 MMOL/ML IV SOLN

[Series 1600: cor cemra ft · coronal · 1.2mm · 0.59mm/px · 7 of 105 slices shown]
[im 1/105]
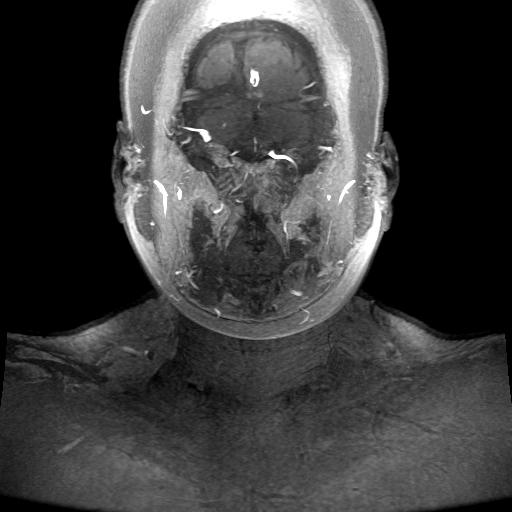
[im 18/105]
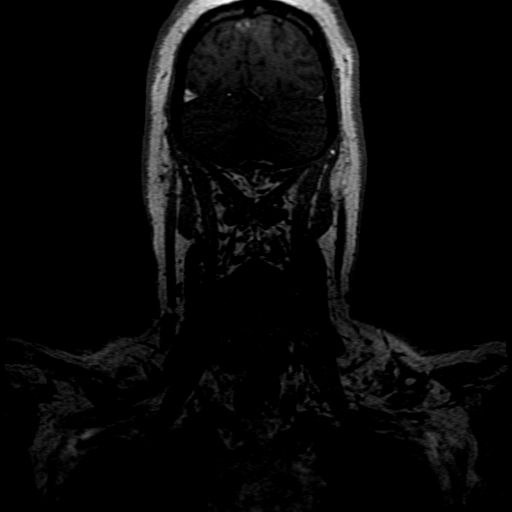
[im 35/105]
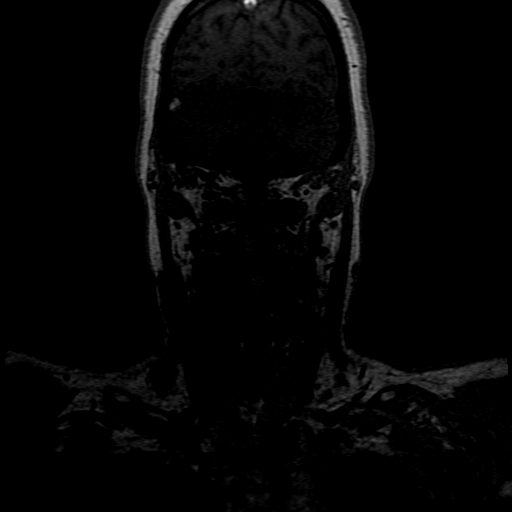
[im 53/105]
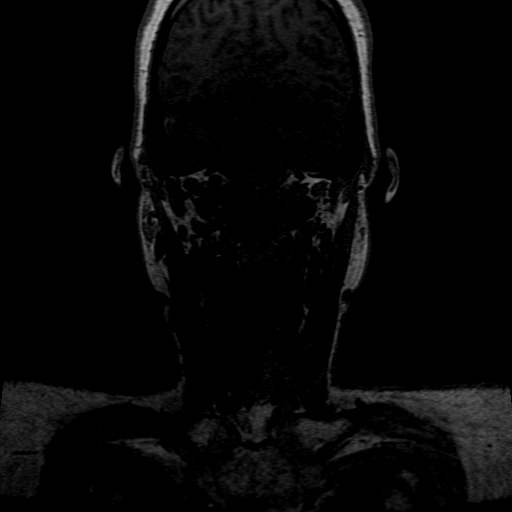
[im 70/105]
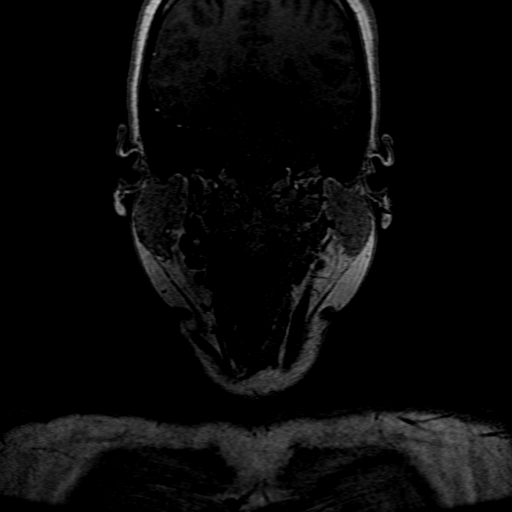
[im 87/105]
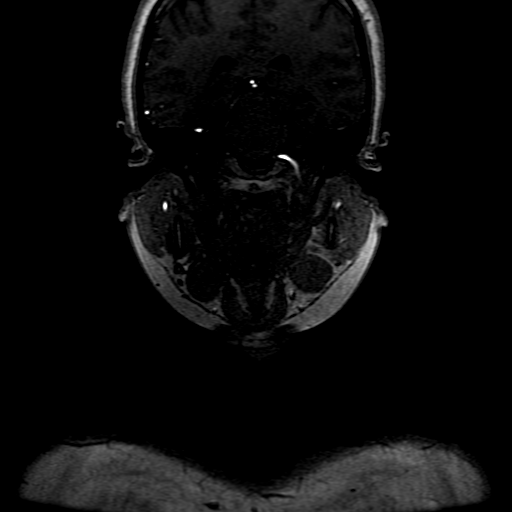
[im 105/105]
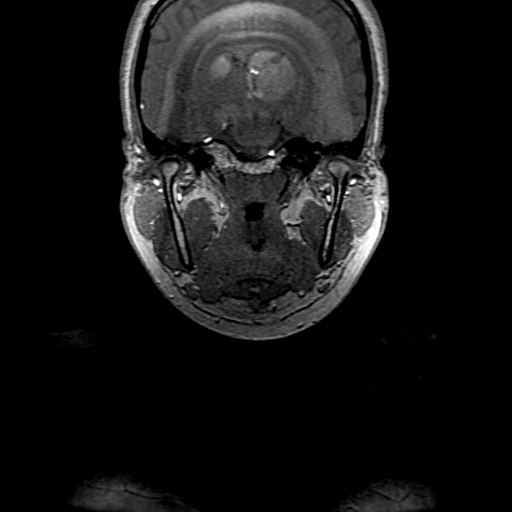

[Series 1601: ph1/cor cemra ft · coronal · 1.2mm · 0.59mm/px · 5 of 104 slices shown]
[im 1/104]
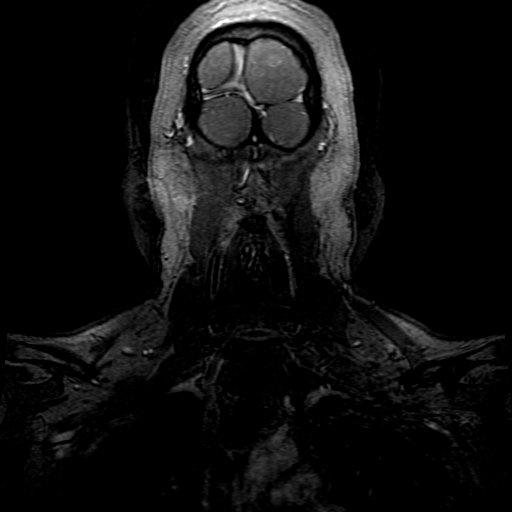
[im 18/104]
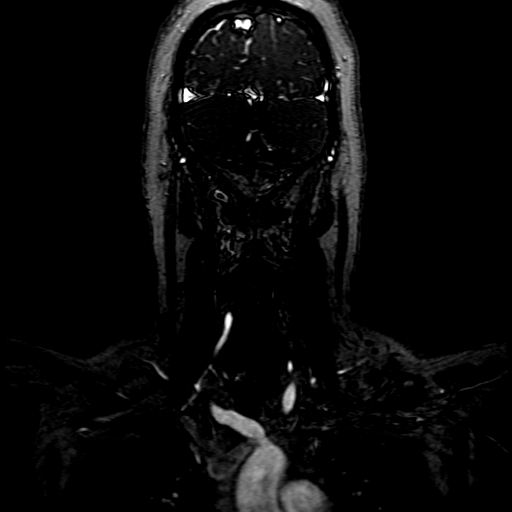
[im 35/104]
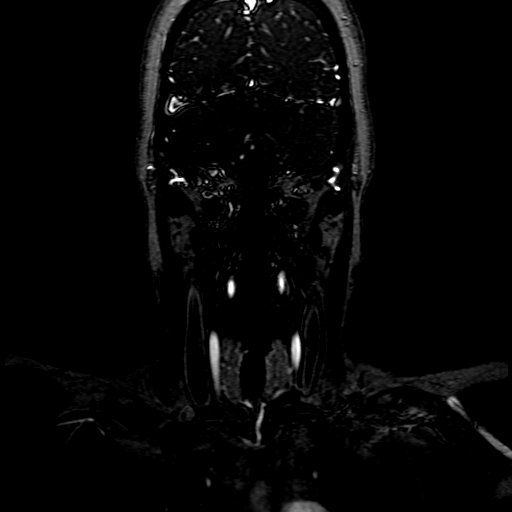
[im 52/104]
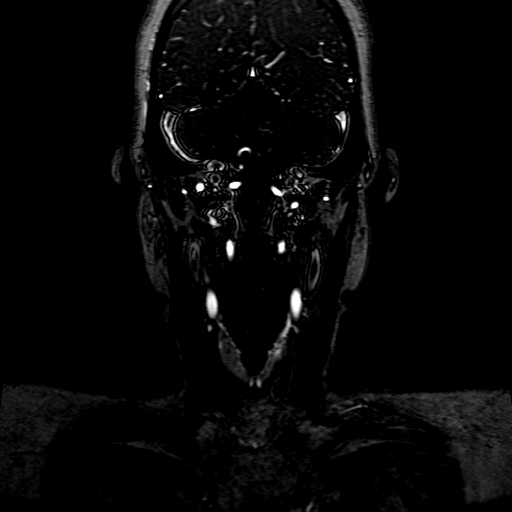
[im 86/104]
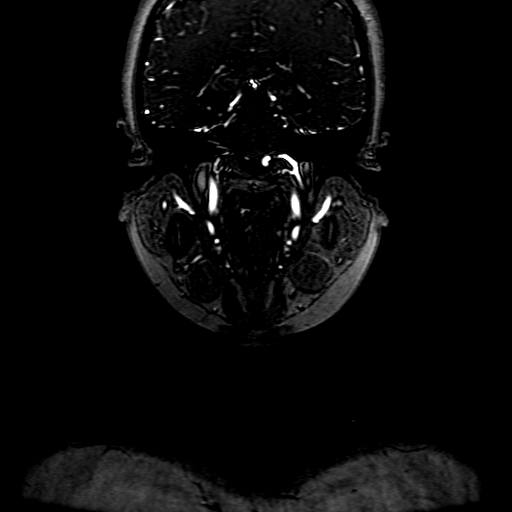

[Series 1602: ph2/cor cemra ft · coronal · 1.2mm · 0.59mm/px · 3 of 105 slices shown]
[im 18/105]
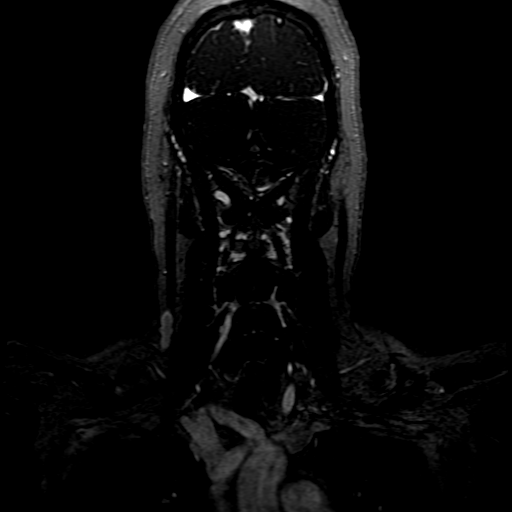
[im 53/105]
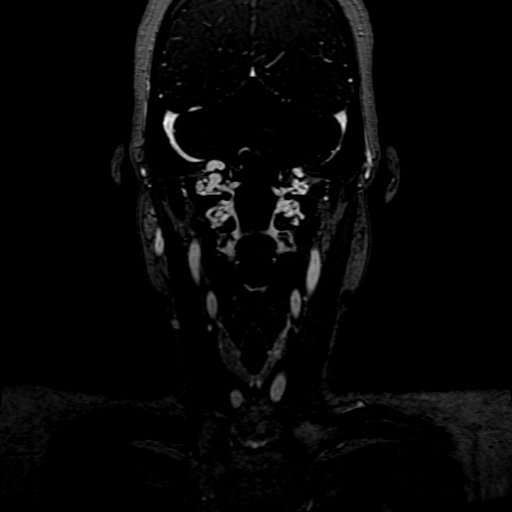
[im 87/105]
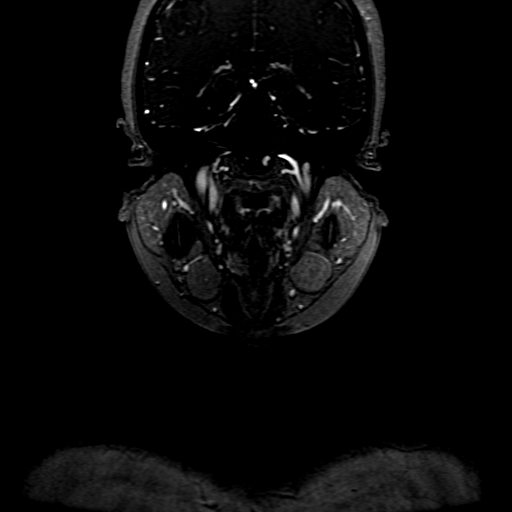

[((date))-((date)) · coronal · 1.2mm · 0.59mm/px · 3 of 105 slices shown]
[im 18/105]
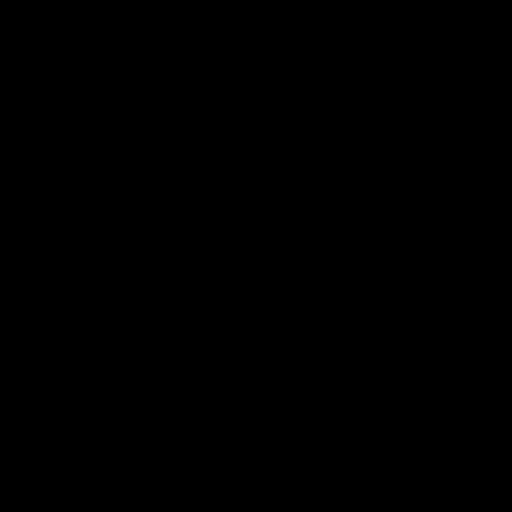
[im 53/105]
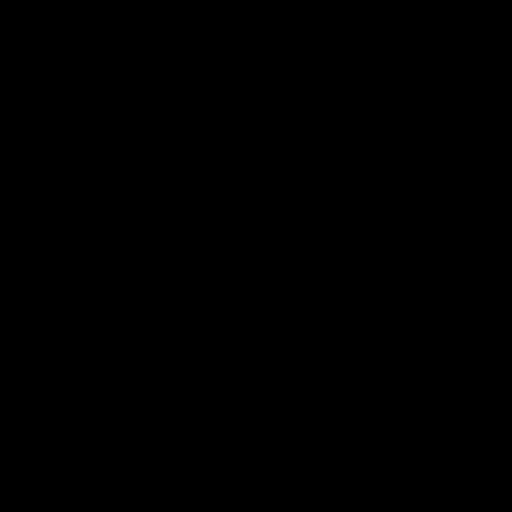
[im 87/105]
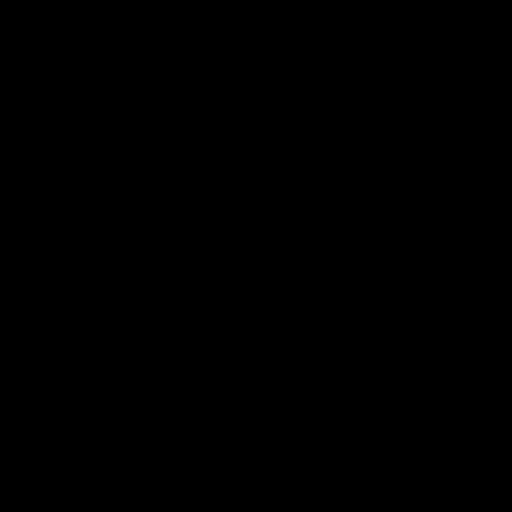

[18 of 48 positions shown; findings below may reference images not displayed]

FINDINGS: MR HEAD FINDINGS

Brain: There is no evidence of an acute infarct, intracranial
hemorrhage, mass, midline shift, or extra-axial fluid collection.
The ventricles and sulci are normal. The brain is normal in signal.
The cerebellar tonsils are normally positioned. The pituitary gland
is normal in size.

Vascular: Major intracranial vascular flow voids are preserved.

Skull and upper cervical spine: Unremarkable bone marrow signal.

Sinuses/Orbits: Unremarkable orbits. Mild scattered mucosal
thickening in the paranasal sinuses. Clear mastoid air cells.

Other: None.

MRA HEAD FINDINGS

Anterior circulation: The internal carotid arteries are widely
patent from skull base to carotid termini. ACAs and MCAs are patent
without evidence of a proximal branch occlusion or significant
proximal stenosis. No aneurysm is identified.

Posterior circulation: The intracranial vertebral arteries are
widely patent to the basilar. The basilar artery is widely patent.
There are small posterior communicating arteries bilaterally. Both
PCAs are patent without evidence of a significant proximal stenosis.
No aneurysm is identified.

Anatomic variants: None.

MRA NECK FINDINGS

Aortic arch: Normal variant 4 vessel aortic arch with the left
vertebral artery arising from the arch. No arch vessel origin
stenosis.

Right carotid system: Patent and smooth without evidence of stenosis
or dissection.

Left carotid system: Patent and smooth without evidence of stenosis
or dissection.

Vertebral arteries: Patent with antegrade flow bilaterally. Mildly
dominant right vertebral artery. No evidence of a significant
stenosis or dissection.
IMPRESSION: 1. Unremarkable appearance of the brain.
2. Negative head MRA.
3. Negative neck MRA.

## 2021-06-24 IMAGING — MR MR HEAD W/O CM
6 of 11 series · 24 of 48 positions shown · IV contrast (gadavist)
Comparison: No pertinent prior exam.

CLINICAL DATA: Neuro deficit, acute, stroke suspected. Dizziness.
Headache.

EXAM:
MRI HEAD WITHOUT CONTRAST
MRA HEAD WITHOUT CONTRAST
MRA OF THE NECK WITHOUT AND WITH CONTRAST
TECHNIQUE: Multiplanar, multi-echo pulse sequences of the brain and surrounding
structures were acquired without intravenous contrast. Angiographic
images of the Circle of Willis were acquired using MRA technique
without intravenous contrast. Angiographic images of the neck were
acquired using MRA technique without and with intravenous contrast.
Carotid stenosis measurements (when applicable) are obtained
utilizing NASCET criteria, using the distal internal carotid
diameter as the denominator.
CONTRAST:  6.5mL GADAVIST GADOBUTROL 1 MMOL/ML IV SOLN

[Series 3: DWI · axial · 3.0mm · 0.94mm/px · z∈[-5,+135]mm · 7 of 97 slices shown (1 of 2)]
[im 1/97]
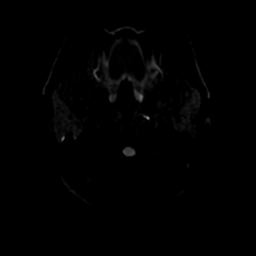
[im 17/97]
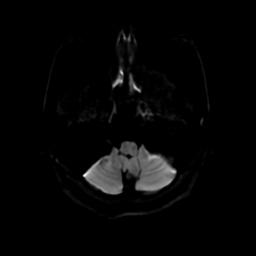
[im 33/97]
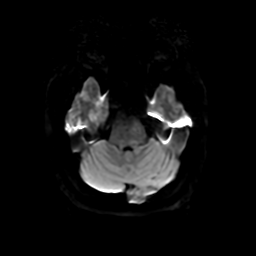
[im 49/97]
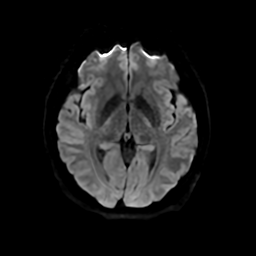
[im 65/97]
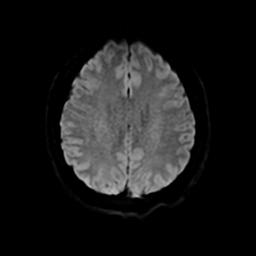
[im 81/97]
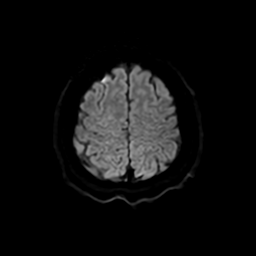
[im 97/97]
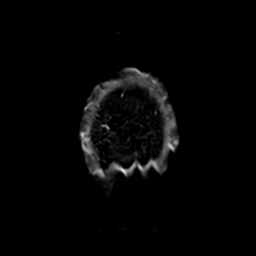

[Series 5: DWI · coronal · 4.0mm · 0.94mm/px · 5 of 72 slices shown (2 of 2)]
[im 1/72]
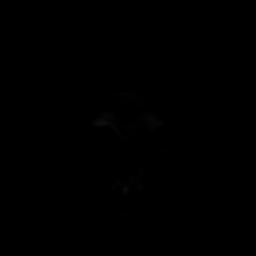
[im 18/72]
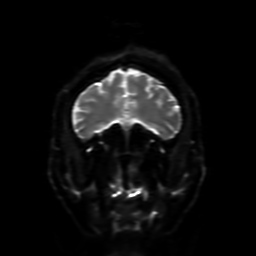
[im 36/72]
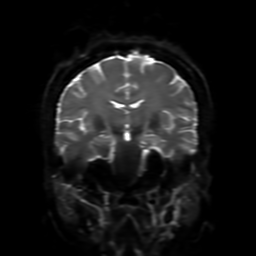
[im 54/72]
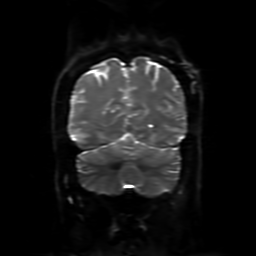
[im 72/72]
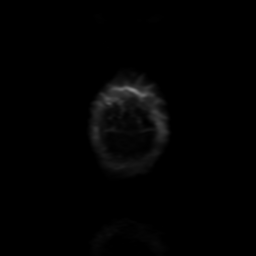

[Series 6: FLAIR · sagittal · 5.0mm · 0.23mm/px · 2 of 24 slices shown (1 of 2)]
[im 1/24]
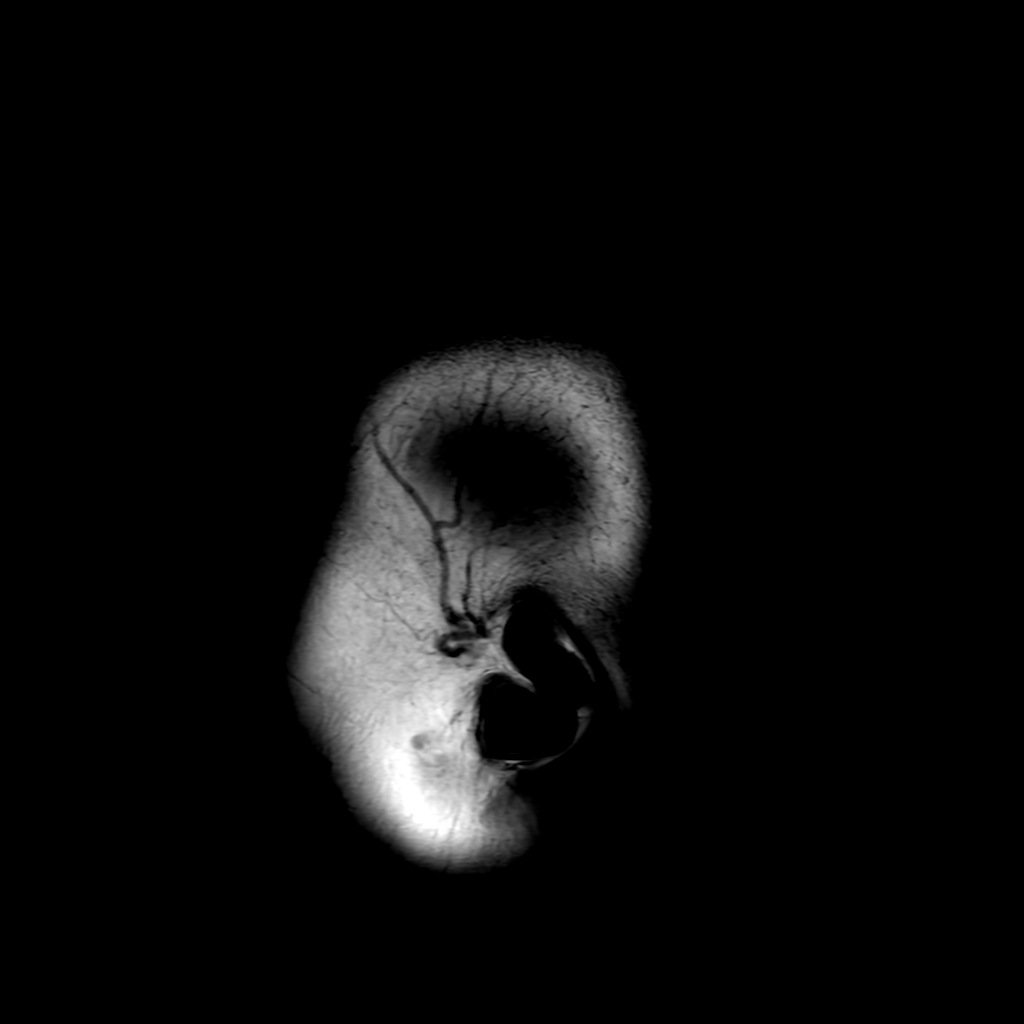
[im 24/24]
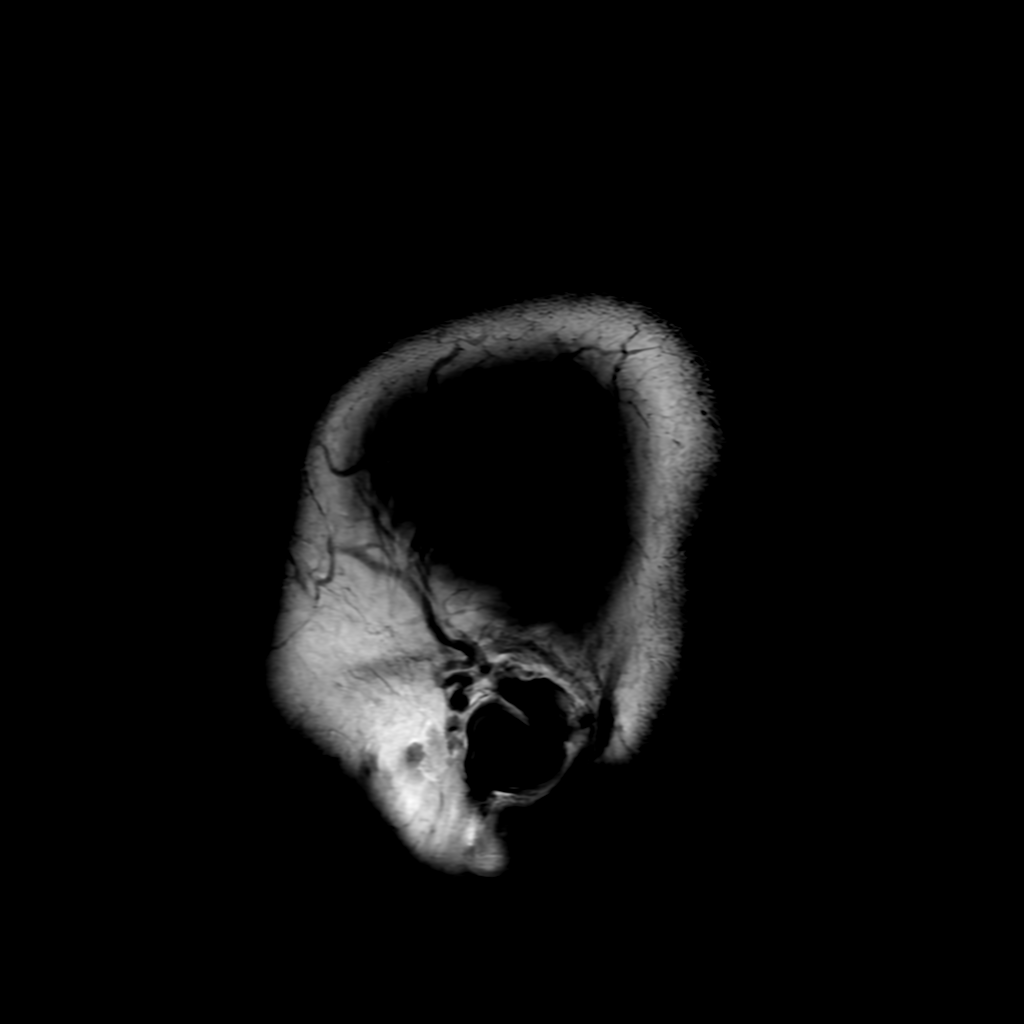

[Series 8: FLAIR · axial · 4.0mm · 0.45mm/px · z∈[+9,+140]mm · 3 of 32 slices shown (2 of 2)]
[im 1/32]
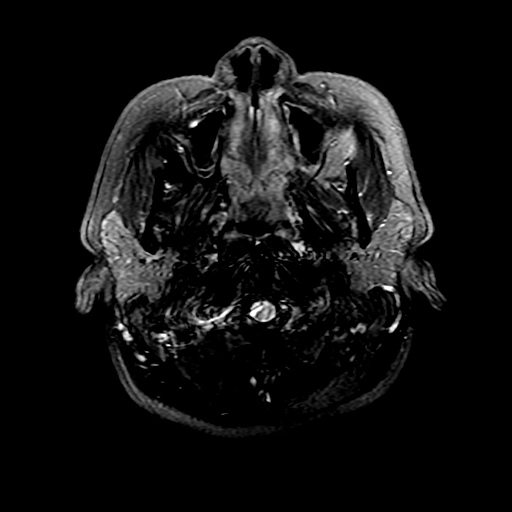
[im 16/32]
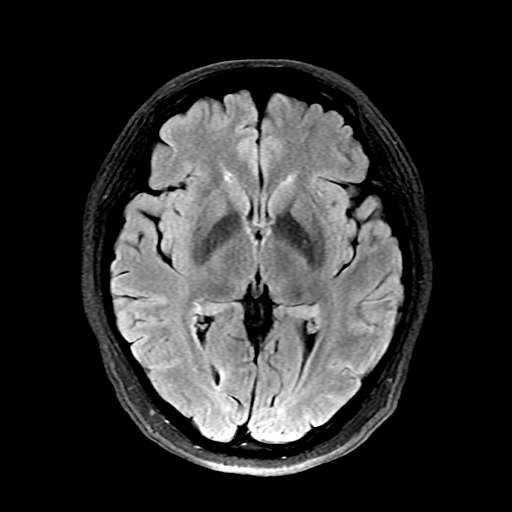
[im 32/32]
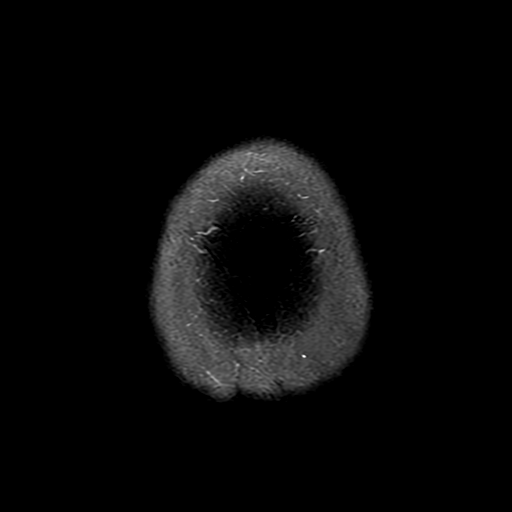

[Series 350: ADC · axial · 3.0mm · 0.94mm/px · z∈[-5,+135]mm · 4 of 50 slices shown (1 of 2)]
[im 1/50]
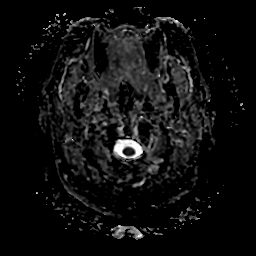
[im 17/50]
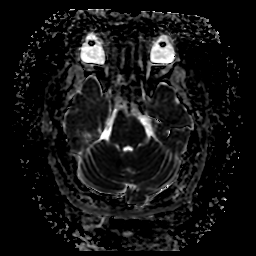
[im 33/50]
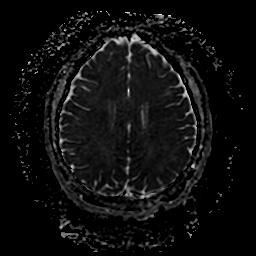
[im 50/50]
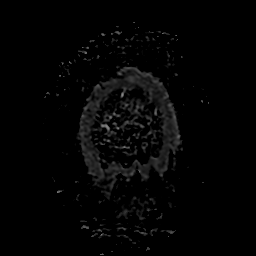

[Series 550: ADC · coronal · 4.0mm · 0.94mm/px · 3 of 36 slices shown (2 of 2)]
[im 1/36]
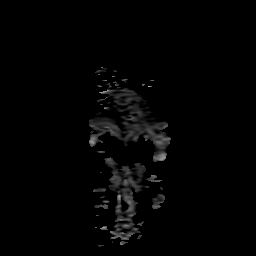
[im 18/36]
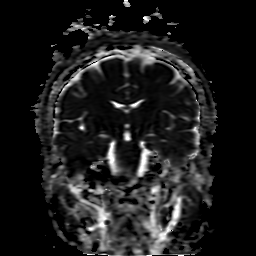
[im 36/36]
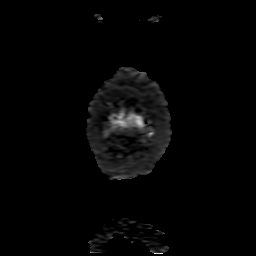

[24 of 48 positions shown; findings below may reference images not displayed]

FINDINGS: MR HEAD FINDINGS

Brain: There is no evidence of an acute infarct, intracranial
hemorrhage, mass, midline shift, or extra-axial fluid collection.
The ventricles and sulci are normal. The brain is normal in signal.
The cerebellar tonsils are normally positioned. The pituitary gland
is normal in size.

Vascular: Major intracranial vascular flow voids are preserved.

Skull and upper cervical spine: Unremarkable bone marrow signal.

Sinuses/Orbits: Unremarkable orbits. Mild scattered mucosal
thickening in the paranasal sinuses. Clear mastoid air cells.

Other: None.

MRA HEAD FINDINGS

Anterior circulation: The internal carotid arteries are widely
patent from skull base to carotid termini. ACAs and MCAs are patent
without evidence of a proximal branch occlusion or significant
proximal stenosis. No aneurysm is identified.

Posterior circulation: The intracranial vertebral arteries are
widely patent to the basilar. The basilar artery is widely patent.
There are small posterior communicating arteries bilaterally. Both
PCAs are patent without evidence of a significant proximal stenosis.
No aneurysm is identified.

Anatomic variants: None.

MRA NECK FINDINGS

Aortic arch: Normal variant 4 vessel aortic arch with the left
vertebral artery arising from the arch. No arch vessel origin
stenosis.

Right carotid system: Patent and smooth without evidence of stenosis
or dissection.

Left carotid system: Patent and smooth without evidence of stenosis
or dissection.

Vertebral arteries: Patent with antegrade flow bilaterally. Mildly
dominant right vertebral artery. No evidence of a significant
stenosis or dissection.
IMPRESSION: 1. Unremarkable appearance of the brain.
2. Negative head MRA.
3. Negative neck MRA.

## 2021-06-24 MED ORDER — LORAZEPAM 2 MG/ML IJ SOLN
1.0000 mg | Freq: Once | INTRAMUSCULAR | Status: DC | PRN
Start: 2021-06-24 — End: 2021-06-24

## 2021-06-24 MED ORDER — SODIUM CHLORIDE 0.9 % IV BOLUS
1000.0000 mL | Freq: Once | INTRAVENOUS | Status: AC
Start: 2021-06-24 — End: 2021-06-24
  Administered 2021-06-24: 1000 mL via INTRAVENOUS

## 2021-06-24 MED ORDER — SODIUM CHLORIDE 0.9 % IV BOLUS
1000.0000 mL | Freq: Once | INTRAVENOUS | Status: AC
  Administered 2021-06-24: 1000 mL via INTRAVENOUS

## 2021-06-24 MED ORDER — MECLIZINE HCL 25 MG PO TABS: 25.0000 mg | ORAL_TABLET | Freq: Three times a day (TID) | ORAL | 0 refills | Status: DC | PRN

## 2021-06-24 MED ORDER — GADOBUTROL 1 MMOL/ML IV SOLN
6.5000 mL | Freq: Once | INTRAVENOUS | Status: AC | PRN
  Administered 2021-06-24: 6.5 mL via INTRAVENOUS

## 2021-06-24 MED ORDER — MECLIZINE HCL 25 MG PO TABS
25.0000 mg | ORAL_TABLET | Freq: Once | ORAL | Status: AC
  Administered 2021-06-24: 25 mg via ORAL
  Filled 2021-06-24: qty 1

## 2021-06-24 NOTE — ED Provider Notes (Signed)
  Physical Exam  BP (!) 107/46   Pulse 62   Temp 98.5 F (36.9 C) (Oral)   Resp 16   Ht 5' 2"  (1.575 m)   Wt 67.1 kg   LMP 05/02/2021   SpO2 98%   BMI 27.06 kg/m   Physical Exam  ED Course/Procedures   Clinical Course as of 06/24/21 1804  Sun Jun 24, 2021  1213 MD to see patient [GL]  1315 MD recommends MRA head, MRA neck, MR brain to rule out Vertebral artery dissection and cerebellar stroke. If negative, patient can go home [GL]    Clinical Course User Index [GL] Loeffler, Adora Fridge, PA-C    Procedures  MDM   Care this patient assumed from preceding ED provider Paulita Cradle, PA-C at time of shift change.  Please see her associated note for the incident the patient's ED course.  In brief patient is a 35 year old female with history of anxiety and ulcerative colitis presenting the emergency department for 3 days of dizziness feeling that the room was spinning.  Some associated chest pain.  Laboratory today are very reassuring.  CBC and BMP were unremarkable, troponin is negative and patient is not pregnant.  EKG was reassuring with normal sinus rhythm, chest x-ray negative for acute cardiopulmonary disease.  Ultimately patient's presentation was felt to be most consistent with vertigo, however at time of shift change she is pending MRI of the brain and MRA head and neck.  If normal plan is for patient to be discharged home with ENT follow-up for vertigo.  MRI brain unremarkable.  Negative MRA of the head and the neck.  Given reassuring imaging and laboratory studies, no further work-up is warranted the emergency department at this time.  Patient was found to be hypotensive throughout her stay in the emergency department, however at time of my evaluation placed a more appropriately fitting (smaller) blood pressure cuff and the patient with improvement in her blood pressure reading.  Feel episodes of hypotension were most likely related to poor fitting blood pressure cuff.  Patient  ambulated to and from the restroom independently without difficulty.  While she is still experiencing episodes of dizziness, feel she is stable to be discharged at this time.  Brynlynn voiced understanding of her medical evaluation and treatment plan. Each of her questions was answered to her expressed satisfaction.  Return precautions are given.  Patient is well-appearing, stable, appropriate for discharge at this time.  This chart was dictated using voice recognition software, Dragon. Despite the best efforts of this provider to proofread and correct errors, errors may still occur which can change documentation meaning.     Emeline Darling, PA-C 06/24/21 1808    Lajean Saver, MD 06/24/21 2330

## 2021-06-24 NOTE — ED Provider Notes (Signed)
I provided a substantive portion of the care of this patient.  I personally performed the entirety of the medical decision making for this encounter.  EKG Interpretation  Date/Time:  Saturday June 23 2021 20:37:10 EDT Ventricular Rate:  91 PR Interval:  158 QRS Duration: 76 QT Interval:  360 QTC Calculation: 442 R Axis:   79 Text Interpretation: Normal sinus rhythm with sinus arrhythmia Normal ECG Confirmed by Charlesetta Shanks [phone]) on 06/24/2021 1:09:23 PM   Patient reports she developed sudden onset of dizziness and feeling like she would pass out 4 days ago.  She had been working outside with her family.  She denies that she was particularly fatigued or having exertion.  She reports she was doing some painting but was not physically exerting and was not in an uncomfortable position.  She quite suddenly felt dizzy with some movement and spinning sensation in the intense feeling that she would pass out.  She lowered herself to the ground.  She did not lose consciousness.  She did not have any injury associated with this.  She reports the symptoms were persisting so she went in the house and tried to drink some water and rest.  She reports since onset she has been having some posterior pressure headache and pressure headache behind the forehead.  She denies onset with any severe or intense headache.  No photophobia and no visual changes.  Symptoms have persisted since onset she is otherwise healthy  Alert appropriate clear mental status.  Pronator 2 through 12 intact.  Finger-nose exam normal bilaterally.  No focal motor deficits Mildly reproducible vertiginous symptoms with abrupt head turn.  Patient denies any improvement with treatment in the emergency department for vertigo.  Electrolytes normal.  COVID-negative.  We will proceed with MRI/MRA to rule out cerebellar injury or aneurysm or dissection.  If normal, anticipate discharge with follow-up with ENT and symptomatic treatment.     Charlesetta Shanks, MD 06/24/21 1314

## 2021-06-24 NOTE — Discharge Instructions (Addendum)
You were seen in the ER today for your dizziness.  Your physical exam, vital signs, blood work, and MRI were very reassuring.  You have been diagnosed with vertigo.  Please call the ear nose and throat providers listed below for follow-up and further management.  You have been prescribed a medication called meclizine to help with the dizziness until that time.  Please return to the emergency department if you develop any worsening dizziness, nausea or vomiting does not stop, severe headache, or any other new severe symptom.

## 2021-06-24 NOTE — ED Provider Notes (Signed)
Rachel Hanson   CSN: [id number] Arrival date & time: 06/23/21  2035     History Chief Complaint  Patient presents with   Dizziness   Chest Pain    Rachel Hanson is a 35 y.o. female.  Patient has a past medical history of anxiety and ulcerative colitis.  Presents the emergency department with complaints of dizziness chest pain.  He states that she was outside on Thursday when she started to feel dizzy.  She describes the dizziness as lightheaded.  When she is moving or she turns her head she describes it as room spinning.  She says that she feels lightheaded constantly though.  She describes associated nausea with no vomiting.  She also describes some chills.  She says that it is worse with movement and worse with eating. She also claims that she has had some chest tightness in her mid epigastum and right upper quadrant of her abdomen stents into her mid chest.  Does not this is intermittent.  It is not pleuritic.  She describes it as tightness.  She says that she has associated headache and pain at the bottom of her neck.  She also describes body aches.  She says that she recently had a COVID infection about a month ago.  She took a pregnancy test at home and it was negative.   Dizziness Associated symptoms: headaches and nausea   Associated symptoms: no blood in stool, no chest pain, no diarrhea, no palpitations, no shortness of breath, no vomiting and no weakness   Chest Pain Associated symptoms: dizziness, fatigue, headache and nausea   Associated symptoms: no abdominal pain, no back pain, no cough, no diaphoresis, no fever, no palpitations, no shortness of breath, no vomiting and no weakness       Past Medical History:  Diagnosis Date   Bartholin cyst    HSV (herpes simplex virus) anogenital infection    Ulcerative colitis ([place]) 2001   when she was in [place]    Patient Active Problem List   Diagnosis Date Noted    Indication for care in labor or delivery 03/13/2020   Status post primary low transverse cesarean section 03/13/2020   Pericardial effusion in fetus affecting management of mother 02/09/2020   Bartholin gland cyst 06/23/2019    Past Surgical History:  Procedure Laterality Date   CESAREAN SECTION N/A 03/13/2020   Procedure: CESAREAN SECTION;  Surgeon: Sherlyn Hay, DO;  Location: [facility];  Service: Obstetrics;  Laterality: N/A;   CYSTECTOMY     L wrist     OB History     Gravida  1   Para  1   Term  1   Preterm  0   AB  0   Living  1      SAB  0   IAB  0   Ectopic  0   Multiple  0   Live Births  1           Family History  Adopted: Yes    Social History   Tobacco Use   Smoking status: Every Day    Packs/day: 0.25    Types: Cigarettes   Smokeless tobacco: Never  Vaping Use   Vaping Use: Never used  Substance Use Topics   Alcohol use: Not Currently    Comment: pregnant   Drug use: Never    Home Medications Prior to Admission medications   Medication Sig Start Date End  Date Taking? Authorizing Provider  ibuprofen (ADVIL) 200 MG tablet Take 200 mg by mouth every 6 (six) hours as needed for mild pain.   Yes [provider]  SLYND 4 MG TABS Take 1 tablet by mouth daily. 05/30/21  Yes [provider]  acetaminophen (TYLENOL) 325 MG tablet Take 2 tablets (650 mg total) by mouth every 6 (six) hours as needed for moderate pain or fever. Patient not taking: Reported on 06/24/2021 06/23/19   Darlina Rumpf, CNM  AMBULATORY NON FORMULARY MEDICATION Medication Name: Nitroglycerin 0.125 ointment three times a day apply rectally up until the first knuckle after fingernail for 6-8 weeks. Patient not taking: Reported on 06/24/2021 11/24/19   Thornton Park, MD  ibuprofen (ADVIL) 600 MG tablet Take 1 tablet (600 mg total) by mouth every 6 (six) hours as needed for moderate pain. Patient not taking: Reported on 06/24/2021 03/16/20    Bovard-Stuckert, Jeral Fruit, MD  oxyCODONE (OXY IR/ROXICODONE) 5 MG immediate release tablet Take 1-2 tablets (5-10 mg total) by mouth every 6 (six) hours as needed for moderate pain or severe pain. Patient not taking: Reported on 06/24/2021 03/16/20   Bovard-Stuckert, Jeral Fruit, MD  Prenatal Vit-Fe Fumarate-FA (MULTIVITAMIN-PRENATAL) 27-0.8 MG TABS tablet Take 2 tablets by mouth daily at 12 noon. Patient not taking: Reported on 06/24/2021 03/16/20   Janyth Contes, MD    Allergies    Bactrim [sulfamethoxazole-trimethoprim]  Review of Systems   Review of Systems  Constitutional:  Positive for chills and fatigue. Negative for diaphoresis and fever.  HENT:  Negative for congestion, rhinorrhea and sore throat.   Eyes:  Negative for visual disturbance.  Respiratory:  Positive for chest tightness. Negative for cough and shortness of breath.   Cardiovascular:  Negative for chest pain, palpitations and leg swelling.  Gastrointestinal:  Positive for nausea. Negative for abdominal pain, blood in stool, constipation, diarrhea and vomiting.  Genitourinary:  Negative for dysuria, flank pain and hematuria.  Musculoskeletal:  Positive for myalgias. Negative for back pain.  Skin:  Negative for rash and wound.  Neurological:  Positive for dizziness and headaches. Negative for syncope, weakness and light-headedness.  Psychiatric/Behavioral:  Negative for confusion.   All other systems reviewed and are negative.  Physical Exam Updated Vital Signs BP (!) 87/58 (BP Location: Left Arm)   Pulse 67   Temp 98.9 F (37.2 C) (Oral)   Resp 18   Ht 5' 2"  (1.575 m)   Wt 67.1 kg   LMP 05/02/2021   SpO2 98%   BMI 27.06 kg/m   Physical Exam Vitals and nursing Hanson reviewed.  Constitutional:      General: She is not in acute distress.    Appearance: Normal appearance. She is not ill-appearing, toxic-appearing or diaphoretic.  HENT:     Head: Normocephalic and atraumatic.     Nose: No nasal deformity.      Mouth/Throat:     Lips: Pink. No lesions.     Mouth: Mucous membranes are dry. No injury, lacerations, oral lesions or angioedema.     Pharynx: Oropharynx is clear. Uvula midline. No pharyngeal swelling, oropharyngeal exudate, posterior oropharyngeal erythema or uvula swelling.  Eyes:     General: Gaze aligned appropriately. No scleral icterus.       Right eye: No discharge.        Left eye: No discharge.     Conjunctiva/sclera: Conjunctivae normal.     Right eye: Right conjunctiva is not injected. No exudate or hemorrhage.    Left eye: Left  conjunctiva is not injected. No exudate or hemorrhage.    Pupils: Pupils are equal, round, and reactive to light.  Cardiovascular:     Rate and Rhythm: Normal rate and regular rhythm.     Pulses: Normal pulses.          Radial pulses are 2+ on the right side and 2+ on the left side.       Dorsalis pedis pulses are 2+ on the right side and 2+ on the left side.     Heart sounds: Normal heart sounds, S1 normal and S2 normal. Heart sounds not distant. No murmur heard.   No friction rub. No gallop. No S3 or S4 sounds.  Pulmonary:     Effort: Pulmonary effort is normal. No accessory muscle usage or respiratory distress.     Breath sounds: Normal breath sounds. No stridor. No wheezing, rhonchi or rales.  Chest:     Chest wall: No tenderness.  Abdominal:     General: Abdomen is flat. Bowel sounds are normal. There is no distension.     Palpations: Abdomen is soft. There is no mass or pulsatile mass.     Tenderness: There is no abdominal tenderness. There is no guarding or rebound.  Musculoskeletal:     Cervical back: Normal range of motion. No rigidity or tenderness. No spinous process tenderness.     Right lower leg: No edema.     Left lower leg: No edema.  Skin:    General: Skin is warm and dry.     Coloration: Skin is not jaundiced or pale.     Findings: No bruising, erythema, lesion or rash.  Neurological:     General: No focal deficit present.      Mental Status: She is alert and oriented to person, place, and time.     GCS: GCS eye subscore is 4. GCS verbal subscore is 5. GCS motor subscore is 6.  Psychiatric:        Mood and Affect: Mood normal.        Behavior: Behavior normal. Behavior is cooperative.    ED Results / Procedures / Treatments   Labs (all labs ordered are listed, but only abnormal results are displayed) Labs Reviewed  RESP PANEL BY RT-PCR (FLU A&B, COVID) ARPGX2  BASIC METABOLIC PANEL  CBC  LIPASE, BLOOD  I-STAT BETA HCG BLOOD, ED ([facility], [facility], [facility] ONLY)  TROPONIN I (HIGH SENSITIVITY)  TROPONIN I (HIGH SENSITIVITY)    EKG EKG Interpretation  Date/Time:  Saturday June 23 2021 20:37:10 EDT Ventricular Rate:  91 PR Interval:  158 QRS Duration: 76 QT Interval:  360 QTC Calculation: 442 R Axis:   79 Text Interpretation: Normal sinus rhythm with sinus arrhythmia Normal ECG Confirmed by Charlesetta Shanks [phone]) on 06/24/2021 1:09:23 PM  Radiology DG Chest 2 View  Result Date: 06/23/2021 CLINICAL DATA:  Dizziness and chest discomfort for 3 days. EXAM: CHEST - 2 VIEW COMPARISON:  None. FINDINGS: The heart size and mediastinal contours are within normal limits. Both lungs are clear. The visualized skeletal structures are unremarkable. IMPRESSION: Normal exam. Electronically Signed   By: Marlaine Hind M.D.   On: 06/23/2021 21:35   MR ANGIO HEAD WO CONTRAST  Result Date: 06/24/2021 CLINICAL DATA:  Neuro deficit, acute, stroke suspected. Dizziness. Headache. EXAM: MRI HEAD WITHOUT CONTRAST MRA HEAD WITHOUT CONTRAST MRA OF THE NECK WITHOUT AND WITH CONTRAST TECHNIQUE: Multiplanar, multi-echo pulse sequences of the brain and surrounding structures were acquired without intravenous contrast. Angiographic images of  the Circle of Willis were acquired using MRA technique without intravenous contrast. Angiographic images of the neck were acquired using MRA technique without and with intravenous contrast. Carotid stenosis  measurements (when applicable) are obtained utilizing NASCET criteria, using the distal internal carotid diameter as the denominator. CONTRAST:  6.29m GADAVIST GADOBUTROL 1 MMOL/ML IV SOLN COMPARISON:  No pertinent prior exam. FINDINGS: MR HEAD FINDINGS Brain: There is no evidence of an acute infarct, intracranial hemorrhage, mass, midline shift, or extra-axial fluid collection. The ventricles and sulci are normal. The brain is normal in signal. The cerebellar tonsils are normally positioned. The pituitary gland is normal in size. Vascular: Major intracranial vascular flow voids are preserved. Skull and upper cervical spine: Unremarkable bone marrow signal. Sinuses/Orbits: Unremarkable orbits. Mild scattered mucosal thickening in the paranasal sinuses. Clear mastoid air cells. Other: None. MRA HEAD FINDINGS Anterior circulation: The internal carotid arteries are widely patent from skull base to carotid termini. ACAs and MCAs are patent without evidence of a proximal branch occlusion or significant proximal stenosis. No aneurysm is identified. Posterior circulation: The intracranial vertebral arteries are widely patent to the basilar. The basilar artery is widely patent. There are small posterior communicating arteries bilaterally. Both PCAs are patent without evidence of a significant proximal stenosis. No aneurysm is identified. Anatomic variants: None. MRA NECK FINDINGS Aortic arch: Normal variant 4 vessel aortic arch with the left vertebral artery arising from the arch. No arch vessel origin stenosis. Right carotid system: Patent and smooth without evidence of stenosis or dissection. Left carotid system: Patent and smooth without evidence of stenosis or dissection. Vertebral arteries: Patent with antegrade flow bilaterally. Mildly dominant right vertebral artery. No evidence of a significant stenosis or dissection. IMPRESSION: 1. Unremarkable appearance of the brain. 2. Negative head MRA. 3. Negative neck MRA.  Electronically Signed   By: ALogan BoresM.D.   On: 06/24/2021 16:25   MR Angiogram Neck W or Wo Contrast  Result Date: 06/24/2021 CLINICAL DATA:  Neuro deficit, acute, stroke suspected. Dizziness. Headache. EXAM: MRI HEAD WITHOUT CONTRAST MRA HEAD WITHOUT CONTRAST MRA OF THE NECK WITHOUT AND WITH CONTRAST TECHNIQUE: Multiplanar, multi-echo pulse sequences of the brain and surrounding structures were acquired without intravenous contrast. Angiographic images of the Circle of Willis were acquired using MRA technique without intravenous contrast. Angiographic images of the neck were acquired using MRA technique without and with intravenous contrast. Carotid stenosis measurements (when applicable) are obtained utilizing NASCET criteria, using the distal internal carotid diameter as the denominator. CONTRAST:  6.517mGADAVIST GADOBUTROL 1 MMOL/ML IV SOLN COMPARISON:  No pertinent prior exam. FINDINGS: MR HEAD FINDINGS Brain: There is no evidence of an acute infarct, intracranial hemorrhage, mass, midline shift, or extra-axial fluid collection. The ventricles and sulci are normal. The brain is normal in signal. The cerebellar tonsils are normally positioned. The pituitary gland is normal in size. Vascular: Major intracranial vascular flow voids are preserved. Skull and upper cervical spine: Unremarkable bone marrow signal. Sinuses/Orbits: Unremarkable orbits. Mild scattered mucosal thickening in the paranasal sinuses. Clear mastoid air cells. Other: None. MRA HEAD FINDINGS Anterior circulation: The internal carotid arteries are widely patent from skull base to carotid termini. ACAs and MCAs are patent without evidence of a proximal branch occlusion or significant proximal stenosis. No aneurysm is identified. Posterior circulation: The intracranial vertebral arteries are widely patent to the basilar. The basilar artery is widely patent. There are small posterior communicating arteries bilaterally. Both PCAs are patent  without evidence of a significant proximal stenosis. No  aneurysm is identified. Anatomic variants: None. MRA NECK FINDINGS Aortic arch: Normal variant 4 vessel aortic arch with the left vertebral artery arising from the arch. No arch vessel origin stenosis. Right carotid system: Patent and smooth without evidence of stenosis or dissection. Left carotid system: Patent and smooth without evidence of stenosis or dissection. Vertebral arteries: Patent with antegrade flow bilaterally. Mildly dominant right vertebral artery. No evidence of a significant stenosis or dissection. IMPRESSION: 1. Unremarkable appearance of the brain. 2. Negative head MRA. 3. Negative neck MRA. Electronically Signed   By: Logan Bores M.D.   On: 06/24/2021 16:25   MR BRAIN WO CONTRAST  Result Date: 06/24/2021 CLINICAL DATA:  Neuro deficit, acute, stroke suspected. Dizziness. Headache. EXAM: MRI HEAD WITHOUT CONTRAST MRA HEAD WITHOUT CONTRAST MRA OF THE NECK WITHOUT AND WITH CONTRAST TECHNIQUE: Multiplanar, multi-echo pulse sequences of the brain and surrounding structures were acquired without intravenous contrast. Angiographic images of the Circle of Willis were acquired using MRA technique without intravenous contrast. Angiographic images of the neck were acquired using MRA technique without and with intravenous contrast. Carotid stenosis measurements (when applicable) are obtained utilizing NASCET criteria, using the distal internal carotid diameter as the denominator. CONTRAST:  6.97m GADAVIST GADOBUTROL 1 MMOL/ML IV SOLN COMPARISON:  No pertinent prior exam. FINDINGS: MR HEAD FINDINGS Brain: There is no evidence of an acute infarct, intracranial hemorrhage, mass, midline shift, or extra-axial fluid collection. The ventricles and sulci are normal. The brain is normal in signal. The cerebellar tonsils are normally positioned. The pituitary gland is normal in size. Vascular: Major intracranial vascular flow voids are preserved. Skull and  upper cervical spine: Unremarkable bone marrow signal. Sinuses/Orbits: Unremarkable orbits. Mild scattered mucosal thickening in the paranasal sinuses. Clear mastoid air cells. Other: None. MRA HEAD FINDINGS Anterior circulation: The internal carotid arteries are widely patent from skull base to carotid termini. ACAs and MCAs are patent without evidence of a proximal branch occlusion or significant proximal stenosis. No aneurysm is identified. Posterior circulation: The intracranial vertebral arteries are widely patent to the basilar. The basilar artery is widely patent. There are small posterior communicating arteries bilaterally. Both PCAs are patent without evidence of a significant proximal stenosis. No aneurysm is identified. Anatomic variants: None. MRA NECK FINDINGS Aortic arch: Normal variant 4 vessel aortic arch with the left vertebral artery arising from the arch. No arch vessel origin stenosis. Right carotid system: Patent and smooth without evidence of stenosis or dissection. Left carotid system: Patent and smooth without evidence of stenosis or dissection. Vertebral arteries: Patent with antegrade flow bilaterally. Mildly dominant right vertebral artery. No evidence of a significant stenosis or dissection. IMPRESSION: 1. Unremarkable appearance of the brain. 2. Negative head MRA. 3. Negative neck MRA. Electronically Signed   By: ALogan BoresM.D.   On: 06/24/2021 16:25    Procedures Procedures   Medications Ordered in ED Medications  LORazepam (ATIVAN) injection 1 mg (has no administration in time range)  sodium chloride 0.9 % bolus 1,000 mL (0 mLs Intravenous Stopped 06/24/21 1048)  meclizine (ANTIVERT) tablet 25 mg (25 mg Oral Given 06/24/21 1001)  sodium chloride 0.9 % bolus 1,000 mL (1,000 mLs Intravenous New Bag/Given 06/24/21 1334)  gadobutrol (GADAVIST) 1 MMOL/ML injection 6.5 mL (6.5 mLs Intravenous Contrast Given 06/24/21 1611)    ED Course  I have reviewed the triage vital signs and  the nursing notes.  Pertinent labs & imaging results that were available during my care of the patient were reviewed by me and  considered in my medical decision making (see chart for details).  Clinical Course as of 06/24/21 1636  Sun Jun 24, 2021  1213 MD to see patient [GL]  1315 MD recommends MRA head, MRA neck, MR brain to rule out Vertebral artery dissection and cerebellar stroke. If negative, patient can go home [GL]    Clinical Course User Index [GL] Coral Soler, Adora Fridge, PA-C   MDM Rules/Calculators/A&P                          She is well-appearing and nontoxic. Afebrile and vitals stable. Exam with no evidence of neurological deficit, abnormal heart sounds, abnormal lung sounds, abdominal tenderness.  The way that she describes her dizziness seems more consistent with a peripheral cause of vertigo. Will try to treat symptoms with 1 L IV fluids and meclizine.  Will also do COVID and flu testing.  COVID and flu are negative.  No electrolyte abnormalities.  Kidney function normal.  Lipase normal.  Troponin normal.  No leukocytosis.  No anemia.  Patient has been borderline hypotensive during her stay here.  She received a total of 2 L of IV fluids.  Orthostatics negative.  Baseline blood pressure remains pretty low and MAPs have remained > 65. Will continue to monitor.  After [place] saw and evaluated this patient and recommends MRI studies to evaluate for cerebellar stroke and vertebral artery dissection.  Plan to discharge home with ENT follow-up if MRI is negative.  Will admit MRI shows evidence of dissection or stroke.    4:36 PM Care of Hooria Gasparini Geffrard transferred to [facility] and Dr. Ashok Cordia at the end of my shift as the patient will require reassessment once labs/imaging have resulted. Patient presentation, ED course, and plan of care discussed with review of all pertinent labs and imaging. Please see his/her Hanson for further details regarding further ED course and  disposition. Plan at time of handoff is to follow up on MRI results. ENT outpatient follow up if negative. Admit if positive. This may be altered or completely changed at the discretion of the oncoming team pending results of further workup.   Final Clinical Impression(s) / ED Diagnoses Final diagnoses:  Dizziness    Rx / DC Orders ED Discharge Orders          Ordered    Ambulatory referral to ENT        06/24/21 [address] Cherlyn Roberts 06/24/21 1636    Charlesetta Shanks, MD 07/09/21 1622

## 2021-06-24 NOTE — ED Notes (Signed)
Patient transported to MRI 

## 2021-07-24 DIAGNOSIS — R42 Dizziness and giddiness: Secondary | ICD-10-CM | POA: Diagnosis not present

## 2021-07-24 DIAGNOSIS — H9312 Tinnitus, left ear: Secondary | ICD-10-CM | POA: Diagnosis not present

## 2021-07-24 DIAGNOSIS — Z419 Encounter for procedure for purposes other than remedying health state, unspecified: Secondary | ICD-10-CM | POA: Diagnosis not present

## 2021-08-23 DIAGNOSIS — Z419 Encounter for procedure for purposes other than remedying health state, unspecified: Secondary | ICD-10-CM | POA: Diagnosis not present

## 2021-09-23 DIAGNOSIS — Z419 Encounter for procedure for purposes other than remedying health state, unspecified: Secondary | ICD-10-CM | POA: Diagnosis not present

## 2021-10-24 DIAGNOSIS — Z419 Encounter for procedure for purposes other than remedying health state, unspecified: Secondary | ICD-10-CM | POA: Diagnosis not present

## 2021-11-21 DIAGNOSIS — Z419 Encounter for procedure for purposes other than remedying health state, unspecified: Secondary | ICD-10-CM | POA: Diagnosis not present

## 2021-12-22 DIAGNOSIS — Z419 Encounter for procedure for purposes other than remedying health state, unspecified: Secondary | ICD-10-CM | POA: Diagnosis not present

## 2022-01-21 DIAGNOSIS — Z419 Encounter for procedure for purposes other than remedying health state, unspecified: Secondary | ICD-10-CM | POA: Diagnosis not present

## 2022-02-21 DIAGNOSIS — Z419 Encounter for procedure for purposes other than remedying health state, unspecified: Secondary | ICD-10-CM | POA: Diagnosis not present

## 2022-02-26 DIAGNOSIS — O99613 Diseases of the digestive system complicating pregnancy, third trimester: Secondary | ICD-10-CM | POA: Diagnosis not present

## 2022-02-26 DIAGNOSIS — Z1389 Encounter for screening for other disorder: Secondary | ICD-10-CM | POA: Diagnosis not present

## 2022-02-26 DIAGNOSIS — Z3009 Encounter for other general counseling and advice on contraception: Secondary | ICD-10-CM | POA: Diagnosis not present

## 2022-02-26 DIAGNOSIS — Z01419 Encounter for gynecological examination (general) (routine) without abnormal findings: Secondary | ICD-10-CM | POA: Diagnosis not present

## 2022-02-26 DIAGNOSIS — Z130 Encounter for screening for diseases of the blood and blood-forming organs and certain disorders involving the immune mechanism: Secondary | ICD-10-CM | POA: Diagnosis not present

## 2022-03-23 DIAGNOSIS — Z419 Encounter for procedure for purposes other than remedying health state, unspecified: Secondary | ICD-10-CM | POA: Diagnosis not present

## 2022-04-23 DIAGNOSIS — Z419 Encounter for procedure for purposes other than remedying health state, unspecified: Secondary | ICD-10-CM | POA: Diagnosis not present

## 2022-05-24 DIAGNOSIS — Z419 Encounter for procedure for purposes other than remedying health state, unspecified: Secondary | ICD-10-CM | POA: Diagnosis not present

## 2022-06-23 DIAGNOSIS — Z419 Encounter for procedure for purposes other than remedying health state, unspecified: Secondary | ICD-10-CM | POA: Diagnosis not present

## 2022-07-24 DIAGNOSIS — Z419 Encounter for procedure for purposes other than remedying health state, unspecified: Secondary | ICD-10-CM | POA: Diagnosis not present

## 2022-08-23 DIAGNOSIS — Z419 Encounter for procedure for purposes other than remedying health state, unspecified: Secondary | ICD-10-CM | POA: Diagnosis not present

## 2022-09-23 DIAGNOSIS — Z419 Encounter for procedure for purposes other than remedying health state, unspecified: Secondary | ICD-10-CM | POA: Diagnosis not present

## 2022-10-24 DIAGNOSIS — Z419 Encounter for procedure for purposes other than remedying health state, unspecified: Secondary | ICD-10-CM | POA: Diagnosis not present

## 2022-11-22 DIAGNOSIS — Z419 Encounter for procedure for purposes other than remedying health state, unspecified: Secondary | ICD-10-CM | POA: Diagnosis not present

## 2022-12-23 DIAGNOSIS — Z419 Encounter for procedure for purposes other than remedying health state, unspecified: Secondary | ICD-10-CM | POA: Diagnosis not present

## 2023-01-22 DIAGNOSIS — Z419 Encounter for procedure for purposes other than remedying health state, unspecified: Secondary | ICD-10-CM | POA: Diagnosis not present

## 2023-02-22 DIAGNOSIS — Z419 Encounter for procedure for purposes other than remedying health state, unspecified: Secondary | ICD-10-CM | POA: Diagnosis not present

## 2023-03-10 ENCOUNTER — Telehealth: Payer: Self-pay | Admitting: General Practice

## 2023-03-10 NOTE — Telephone Encounter (Unsigned)
Patient called to make new patient appointment with Dr. Ruthine Dose. I informed pt that Dr. Ruthine Dose has paused on taking new patients for now. Patient states she was recommended by her mother and brother who are patients of Dr/ Ruthine Dose. Can she be taken on as a new patient? Please Advise.

## 2023-03-11 NOTE — Telephone Encounter (Signed)
Noted  

## 2023-03-24 DIAGNOSIS — Z419 Encounter for procedure for purposes other than remedying health state, unspecified: Secondary | ICD-10-CM | POA: Diagnosis not present

## 2023-04-21 ENCOUNTER — Ambulatory Visit (INDEPENDENT_AMBULATORY_CARE_PROVIDER_SITE_OTHER): Payer: Medicaid Other | Admitting: Physician Assistant

## 2023-04-21 ENCOUNTER — Encounter: Payer: Self-pay | Admitting: Physician Assistant

## 2023-04-21 ENCOUNTER — Encounter (HOSPITAL_COMMUNITY): Payer: Self-pay | Admitting: *Deleted

## 2023-04-21 VITALS — BP 122/66 | HR 108 | Temp 97.7°F | Wt 145.4 lb

## 2023-04-21 DIAGNOSIS — R5383 Other fatigue: Secondary | ICD-10-CM

## 2023-04-21 DIAGNOSIS — K51919 Ulcerative colitis, unspecified with unspecified complications: Secondary | ICD-10-CM | POA: Insufficient documentation

## 2023-04-21 DIAGNOSIS — R42 Dizziness and giddiness: Secondary | ICD-10-CM

## 2023-04-21 DIAGNOSIS — Z0000 Encounter for general adult medical examination without abnormal findings: Secondary | ICD-10-CM | POA: Diagnosis not present

## 2023-04-21 DIAGNOSIS — M25512 Pain in left shoulder: Secondary | ICD-10-CM | POA: Diagnosis not present

## 2023-04-21 DIAGNOSIS — K5100 Ulcerative (chronic) pancolitis without complications: Secondary | ICD-10-CM

## 2023-04-21 LAB — COMPREHENSIVE METABOLIC PANEL
ALT: 20 U/L (ref 0–35)
AST: 16 U/L (ref 0–37)
Albumin: 4.3 g/dL (ref 3.5–5.2)
Alkaline Phosphatase: 64 U/L (ref 39–117)
BUN: 11 mg/dL (ref 6–23)
CO2: 26 mEq/L (ref 19–32)
Calcium: 9.5 mg/dL (ref 8.4–10.5)
Chloride: 104 mEq/L (ref 96–112)
Creatinine, Ser: 0.75 mg/dL (ref 0.40–1.20)
GFR: 100.97 mL/min (ref >60.00)
Glucose, Bld: 98 mg/dL (ref 70–99)
Potassium: 4.2 mEq/L (ref 3.5–5.1)
Sodium: 138 mEq/L (ref 135–145)
Total Bilirubin: 0.5 mg/dL (ref 0.2–1.2)
Total Protein: 6.7 g/dL (ref 6.0–8.3)

## 2023-04-21 LAB — CBC WITH DIFFERENTIAL/PLATELET
Basophils Absolute: 0 10*3/uL (ref 0.0–0.1)
Basophils Relative: 0.6 % (ref 0.0–3.0)
Eosinophils Absolute: 0.2 10*3/uL (ref 0.0–0.7)
Eosinophils Relative: 3.6 % (ref 0.0–5.0)
HCT: 43 % (ref 36.0–46.0)
Hemoglobin: 14 g/dL (ref 12.0–15.0)
Lymphocytes Relative: 22.5 % (ref 12.0–46.0)
Lymphs Abs: 1.4 10*3/uL (ref 0.7–4.0)
MCHC: 32.6 g/dL (ref 30.0–36.0)
MCV: 96.2 fl (ref 78.0–100.0)
Monocytes Absolute: 0.4 10*3/uL (ref 0.1–1.0)
Monocytes Relative: 6.4 % (ref 3.0–12.0)
Neutro Abs: 4.3 10*3/uL (ref 1.4–7.7)
Neutrophils Relative %: 66.9 % (ref 43.0–77.0)
Platelets: 322 10*3/uL (ref 150.0–400.0)
RBC: 4.47 Mil/uL (ref 3.87–5.11)
RDW: 13.2 % (ref 11.5–15.5)
WBC: 6.4 10*3/uL (ref 4.0–10.5)

## 2023-04-21 LAB — LIPID PANEL
Cholesterol: 220 mg/dL — ABNORMAL HIGH (ref 0–200)
HDL: 54.9 mg/dL (ref >39.00)
NonHDL: 164.76
Total CHOL/HDL Ratio: 4
Triglycerides: 209 mg/dL — ABNORMAL HIGH (ref 0.0–149.0)
VLDL: 41.8 mg/dL — ABNORMAL HIGH (ref 0.0–40.0)

## 2023-04-21 LAB — IBC + FERRITIN
Ferritin: 33.1 ng/mL (ref 10.0–291.0)
Iron: 131 ug/dL (ref 42–145)
Saturation Ratios: 33.9 % (ref 20.0–50.0)
TIBC: 386.4 ug/dL (ref 250.0–450.0)
Transferrin: 276 mg/dL (ref 212.0–360.0)

## 2023-04-21 LAB — HEMOGLOBIN A1C: Hgb A1c MFr Bld: 5.5 % (ref 4.6–6.5)

## 2023-04-21 LAB — TSH: TSH: 1.59 u[IU]/mL (ref 0.35–5.50)

## 2023-04-21 LAB — VITAMIN D 25 HYDROXY (VIT D DEFICIENCY, FRACTURES): VITD: 18.53 ng/mL — ABNORMAL LOW (ref 30.00–100.00)

## 2023-04-21 LAB — LDL CHOLESTEROL, DIRECT: Direct LDL: 146 mg/dL

## 2023-04-21 LAB — VITAMIN B12: Vitamin B-12: 406 pg/mL (ref 211–911)

## 2023-04-21 NOTE — Assessment & Plan Note (Signed)
Per pt, diagnosed as a teenager, hasn't had many issues; has not had a colonoscopy except for initial diagnosis.  Advised patient that she does need regular colonoscopy screening with this diagnosis due to -patient is at higher risk for CRC  Denies any colon issues at this time

## 2023-04-21 NOTE — Patient Instructions (Addendum)
Welcome to [facility]! It was a pleasure meeting you today.  As discussed, Please schedule a 1 month follow up visit today.  Labs today, will complete biometric form once everything is resulted.  Referrals to GI and sports med completed today - you will be contacted to schedule.    PLEASE NOTE:  If you had any LAB tests please let us know if you have not heard back within a few days. You may see your results on MyChart before we have a chance to review them but we will give you a call once they are reviewed by [place]. If we ordered any REFERRALS today, please let us know if you have not heard from their office within the next two weeks. Let us know through MyChart if you are needing REFILLS, or have your pharmacy send [place] the request. You can also use MyChart to communicate with me or any office staff.  Please try these tips to maintain a healthy lifestyle:  Eat most of your calories during the day when you are active. Eliminate processed foods including packaged sweets (pies, cakes, cookies), reduce intake of potatoes, white bread, white pasta, and white rice. Look for whole grain options, oat flour or almond flour.  Each meal should contain half fruits/vegetables, one quarter protein, and one quarter carbs (no bigger than a computer mouse).  Cut down on sweet beverages. This includes juice, soda, and sweet tea. Also watch fruit intake, though this is a healthier sweet option, it still contains natural sugar! Limit to 3 servings daily.  Drink at least 1 glass of water with each meal and aim for at least 8 glasses (64 ounces) per day.  Exercise at least 150 minutes every week to the best of your ability.    Take Care,  Yaeli Hartung, PA-C

## 2023-04-21 NOTE — Progress Notes (Signed)
Subjective:    Patient ID: Rachel Hanson, female    DOB: 12/13/1985, 37 y.o.   MRN: [id number]  Chief Complaint  Patient presents with   Annual Exam    Wellness exam and biometrics test    medical concerns     Left shoulder pain  Dizziness  Blurry vision  Weight concerns    HPI 37 y.o. patient presents today for new patient establishment with me.  Patient not sure of last PCP she ever had. Originally from [place]. Married. 3 yo son at home. 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   Current Care Team: GYN annually    Acute concerns: Dizziness / blurred vision - ER last year, states she had a CT that was negative, unsure on labs, no records to review today; intermittent dizzy spells still - feels like a slow spinning sensation, usually has some blurry vision associated   Health maintenance: Lifestyle/ exercise: limited currently  Nutrition: eating dinner after she gets off work, otherwise well-balanced Substance use: vaping Sexual activity: married Immunizations: waiting on records to merge   Past Medical History:  Diagnosis Date   Allergy    Anxiety     Past Surgical History:  Procedure Laterality Date   CESAREAN SECTION      Family History  Adopted: Yes    Social History   Tobacco Use   Smoking status: Former    Current packs/day: 0.00    Average packs/day: 0.5 packs/day for 11.0 years (5.5 ttl pk-yrs)    Types: Cigarettes    Start date: 2010    Quit date: 2021    Years since quitting: 3.5   Smokeless tobacco: Never  Vaping Use   Vaping status: Every Day  Substance Use Topics   Alcohol use: Yes    Alcohol/week: 1.0 standard drink of alcohol    Types: 1 Glasses of wine per week    Comment: 1-2 glasses a week   Drug use: Not Currently    Types: Marijuana     Allergies  Allergen Reactions   Sulfamethoxazole Hives, Swelling and Other (See Comments)    Face redness     Review of Systems NEGATIVE UNLESS  OTHERWISE INDICATED IN HPI      Objective:     BP 122/66   Pulse (!) 108   Temp 97.7 F (36.5 C) (Temporal)   Wt 145 lb 6.4 oz (66 kg)   LMP 04/12/2023   SpO2 99%   Wt Readings from Last 3 Encounters:  04/21/23 145 lb 6.4 oz (66 kg)    BP Readings from Last 3 Encounters:  04/21/23 122/66     Physical Exam Vitals and nursing note reviewed.  Constitutional:      Appearance: Normal appearance. She is normal weight. She is not toxic-appearing.  HENT:     Head: Normocephalic and atraumatic.     Right Ear: Tympanic membrane, ear canal and external ear normal.     Left Ear: Tympanic membrane, ear canal and external ear normal.     Nose: Nose normal.     Mouth/Throat:     Mouth: Mucous membranes are moist.  Eyes:     Extraocular Movements: Extraocular movements intact.     Conjunctiva/sclera: Conjunctivae normal.     Pupils: Pupils are equal, round, and reactive to light.  Cardiovascular:     Rate and Rhythm: Normal rate and regular rhythm.     Pulses: Normal pulses.  Heart sounds: Normal heart sounds.  Pulmonary:     Effort: Pulmonary effort is normal.     Breath sounds: Normal breath sounds.  Abdominal:     General: Abdomen is flat. Bowel sounds are normal.     Palpations: Abdomen is soft.  Musculoskeletal:     Left shoulder: No tenderness, bony tenderness or crepitus. Decreased range of motion.     Cervical back: Normal range of motion and neck supple.  Skin:    General: Skin is warm and dry.  Neurological:     General: No focal deficit present.     Mental Status: She is alert and oriented to person, place, and time.  Psychiatric:     Comments: Started crying when talking about not looking into health concerns sooner        Assessment & Plan:  Encounter for annual physical exam -     VITAMIN D 25 Hydroxy (Vit-D Deficiency, Fractures) -     Vitamin B12 -     TSH -     Comprehensive metabolic panel -     CBC with Differential/Platelet -     IBC +  Ferritin -     Lipid panel -     Hemoglobin A1c  Other fatigue -     VITAMIN D 25 Hydroxy (Vit-D Deficiency, Fractures) -     Vitamin B12 -     TSH -     Comprehensive metabolic panel -     CBC with Differential/Platelet -     IBC + Ferritin -     Lipid panel -     Hemoglobin A1c  Dizziness -     Vitamin B12 -     TSH -     Comprehensive metabolic panel -     CBC with Differential/Platelet -     IBC + Ferritin -     Hemoglobin A1c  Ulcerative pancolitis without complication (HCC) Assessment & Plan: Per pt, diagnosed as a teenager, hasn't had many issues; has not had a colonoscopy except for initial diagnosis.  Advised patient that she does need regular colonoscopy screening with this diagnosis due to -patient is at higher risk for CRC  Denies any colon issues at this time  Orders: -     Ambulatory referral to Gastroenterology  Acute pain of left shoulder -     Ambulatory referral to Sports Medicine    Age-appropriate screening and counseling performed today. Will check labs and call with results. Preventive measures discussed and printed in AVS for patient.   Patient Counseling: [x]   Nutrition: Stressed importance of moderation in sodium/caffeine intake, saturated fat and cholesterol, caloric balance, sufficient intake of fresh fruits, vegetables, and fiber.  [x]   Stressed the importance of regular exercise.   [x]   Substance Abuse: Discussed cessation/primary prevention of tobacco, alcohol, or other drug use; driving or other dangerous activities under the influence; availability of treatment for abuse.   []   Injury prevention: Discussed safety belts, safety helmets, smoke detector, smoking near bedding or upholstery.   []   Sexuality: Discussed sexually transmitted diseases, partner selection, use of condoms, avoidance of unintended pregnancy  and contraceptive alternatives.   []   Dental health: Discussed importance of regular tooth brushing, flossing, and dental visits.   [x]   Health maintenance and immunizations reviewed. Please refer to Health maintenance section.        Return in about 4 weeks (around 05/19/2023) for recheck/follow-up.    Alexiya Franqui M Irwin Toran, PA-C

## 2023-04-24 DIAGNOSIS — Z419 Encounter for procedure for purposes other than remedying health state, unspecified: Secondary | ICD-10-CM | POA: Diagnosis not present

## 2023-04-24 NOTE — Progress Notes (Signed)
I, Stevenson Clinch, CMA acting as a Neurosurgeon for Clementeen Graham, MD.  Rachel Hanson is a 37 y.o. female who presents to [facility] at [facility] today for L shoulder pain x 5 months, worsening over the past 3 months. Locates pain to deep in the joint and lateral aspects. Pain can be severe at times. Sx causing night disturbance, unable to sleep on the left side. Notes stiffness and decreased ROM. Radicular sx present.  Has tried IBU with minimal relief. Minimal neck pain.   Neck pain: minimal Radiates: yes Aggravates: side-lying, computer work, lateral raises, reaching back Treatments tried: IBU  Pertinent review of systems: No fever or chills  Relevant historical information: Ulcerative pancolitis   Exam:  BP 102/74   Pulse 95   Ht 5\' 2"  (1.575 m)   Wt 143 lb 9.6 oz (65.1 kg)   LMP 04/12/2023   SpO2 98%   BMI 26.26 kg/m  General: Well Developed, well nourished, and in no acute distress.   MSK: Left shoulder normal-appearing Range of motion abduction 120 degrees.  External rotation full.  Internal rotation posterior iliac crest. Strength intact. Positive empty can test. Positive Hawkins and Neer's test. Mildly positive Yergason's and speeds test.   Lab and Radiology Results  Procedure: Real-time Ultrasound Guided Injection of left subacromial bursa Device: Philips Affiniti 50G/GE Logiq Images permanently stored and available for review in PACS Ultrasound evaluation prior to injection shows hypoechoic fluid within the proximal biceps tendon sheath.  Mild shoulder bursitis is present.  No retracted rotator cuff tears are visible. Verbal informed consent obtained.  Discussed risks and benefits of procedure. Warned about infection, bleeding, hyperglycemia damage to structures among others. Patient expresses understanding and agreement Time-out conducted.   Noted no overlying erythema, induration, or other signs of local infection.   Skin prepped in a sterile  fashion.   Local anesthesia: Topical Ethyl chloride.   With sterile technique and under real time ultrasound guidance: 40 mg of Kenalog and 2 mL of Marcaine injected into subacromial bursa. Fluid seen entering the bursa.   Completed without difficulty   Pain immediately resolved suggesting accurate placement of the medication.   Advised to call if fevers/chills, erythema, induration, drainage, or persistent bleeding.   Images permanently stored and available for review in the ultrasound unit.  Impression: Technically successful ultrasound guided injection.    X-ray images left shoulder obtained today personally and independently interpreted No acute fractures.  No significant degenerative changes. Await formal radiology review    Assessment and Plan: 37 y.o. female with left shoulder pain.  This has been ongoing since February worsening recently.  Pain thought to be due to shoulder impingement and subacromial bursitis.  It is possible she has adhesive capsulitis but this is less likely.  Plan for subacromial injection today and physical therapy referral.  Recheck in about 6 weeks.   PDMP not reviewed this encounter. Orders Placed This Encounter  Procedures   [place] LIMITED JOINT SPACE STRUCTURES UP LEFT(NO LINKED CHARGES)    Order Specific Question:   Reason for Exam (SYMPTOM  OR DIAGNOSIS REQUIRED)    Answer:   left shoulder pain    Order Specific Question:   Preferred imaging location?    Answer:   [facility] Shoulder Left    Standing Status:   Future    Number of Occurrences:   1    Standing Expiration Date:   05/26/2023    Order Specific Question:  Reason for Exam (SYMPTOM  OR DIAGNOSIS REQUIRED)    Answer:   left shoulder pain    Order Specific Question:   Preferred imaging location?    Answer:   [facility]    Order Specific Question:   Is patient pregnant?    Answer:   No   Ambulatory referral to Physical Therapy    Referral Priority:    Routine    Referral Type:   Physical Medicine    Referral Reason:   Specialty Services Required    Requested Specialty:   Physical Therapy    Number of Visits Requested:   1   No orders of the defined types were placed in this encounter.    Discussed warning signs or symptoms. Please see discharge instructions. Patient expresses understanding.   The above documentation has been reviewed and is accurate and complete Clementeen Graham, M.D.

## 2023-04-25 ENCOUNTER — Encounter: Payer: Self-pay | Admitting: Family Medicine

## 2023-04-25 ENCOUNTER — Ambulatory Visit (INDEPENDENT_AMBULATORY_CARE_PROVIDER_SITE_OTHER): Payer: PRIVATE HEALTH INSURANCE

## 2023-04-25 ENCOUNTER — Ambulatory Visit: Payer: Self-pay

## 2023-04-25 ENCOUNTER — Ambulatory Visit (INDEPENDENT_AMBULATORY_CARE_PROVIDER_SITE_OTHER): Payer: PRIVATE HEALTH INSURANCE | Admitting: Family Medicine

## 2023-04-25 ENCOUNTER — Telehealth: Payer: Self-pay | Admitting: Physician Assistant

## 2023-04-25 VITALS — BP 102/74 | HR 95 | Ht 62.0 in | Wt 143.6 lb

## 2023-04-25 DIAGNOSIS — G8929 Other chronic pain: Secondary | ICD-10-CM

## 2023-04-25 DIAGNOSIS — M25512 Pain in left shoulder: Secondary | ICD-10-CM

## 2023-04-25 MED ORDER — VITAMIN D (ERGOCALCIFEROL) 1.25 MG (50000 UNIT) PO CAPS: 50000.0000 [IU] | ORAL_CAPSULE | ORAL | 2 refills | Status: DC

## 2023-04-25 NOTE — Telephone Encounter (Signed)
Pt states the pharmacy has not received the RX for Vitamin D. Please advise.

## 2023-04-25 NOTE — Patient Instructions (Addendum)
Thank you for coming in today.  Please get an Xray today before you leave  Consider Krill Oil.   You received an injection today. Seek immediate medical attention if the joint becomes red, extremely painful, or is oozing fluid.  I've referred you to Physical Therapy.  Let us know if you don't hear from them in one week.   Check back in 6 weeks

## 2023-04-25 NOTE — Telephone Encounter (Signed)
Spoke to pt told her Rx for Vit D high dose 50,000 units once a week has been sent to the pharmacy. When you complete that you can take 2,000 to 4,000 iu OTC. Pt verbalized understanding. Rx sent

## 2023-04-28 ENCOUNTER — Encounter: Payer: Self-pay | Admitting: *Deleted

## 2023-04-30 NOTE — Progress Notes (Signed)
Left shoulder x-ray looks normal to radiology

## 2023-05-12 ENCOUNTER — Ambulatory Visit: Payer: PRIVATE HEALTH INSURANCE | Attending: Family Medicine

## 2023-05-12 DIAGNOSIS — G8929 Other chronic pain: Secondary | ICD-10-CM | POA: Insufficient documentation

## 2023-05-12 DIAGNOSIS — M25512 Pain in left shoulder: Secondary | ICD-10-CM | POA: Diagnosis not present

## 2023-05-12 DIAGNOSIS — M6281 Muscle weakness (generalized): Secondary | ICD-10-CM | POA: Insufficient documentation

## 2023-05-12 DIAGNOSIS — R293 Abnormal posture: Secondary | ICD-10-CM | POA: Diagnosis not present

## 2023-05-12 NOTE — Therapy (Addendum)
OUTPATIENT PHYSICAL THERAPY SHOULDER EVALUATION   Patient Name: Rachel Hanson MRN: [id number] DOB:August 04, 1986, 37 y.o., female Today's Date: 05/12/2023  END OF SESSION:  PT End of Session - 05/12/23 0839     Visit Number 1    Number of Visits 9    Date for PT Re-Evaluation 07/07/23    Authorization Type Generic and MCD Secondary    PT Start Time 0745    PT Stop Time 0825    PT Time Calculation (min) 40 min    Activity Tolerance Patient tolerated treatment well    Behavior During Therapy WFL for tasks assessed/performed             Past Medical History:  Diagnosis Date   Allergy    Anxiety    Bartholin cyst    HSV (herpes simplex virus) anogenital infection    Ulcerative colitis (HCC) 2001   when she was in [employer]   Past Surgical History:  Procedure Laterality Date   CESAREAN SECTION N/A 03/13/2020   Procedure: CESAREAN SECTION;  Surgeon: Edwinna Areola, DO;  Location: [facility] LD ORS;  Service: Obstetrics;  Laterality: N/A;   CESAREAN SECTION     CYSTECTOMY     L wrist   Patient Active Problem List   Diagnosis Date Noted   Encounter for annual physical exam 04/21/2023   Ulcerative pancolitis without complication (HCC) 04/21/2023   Indication for care in labor or delivery 03/13/2020   Status post primary low transverse cesarean section 03/13/2020   Pericardial effusion in fetus affecting management of mother 02/09/2020   Bartholin gland cyst 06/23/2019    PCP: Allwardt, Crist Infante, PA-C  REFERRING PROVIDER: Rodolph Bong, MD  REFERRING DIAG: [phone] (ICD-10-CM) - Chronic left shoulder pain   THERAPY DIAG:  Chronic left shoulder pain - Plan: PT plan of care cert/re-cert  Muscle weakness (generalized) - Plan: PT plan of care cert/re-cert  Abnormal posture - Plan: PT plan of care cert/re-cert  Rationale for Evaluation and Treatment: Rehabilitation  ONSET DATE: 5 months  SUBJECTIVE:                                                                                                                                                                                       SUBJECTIVE STATEMENT: Pt presents to PT with roughly 5 month hx of increasing L shoulder pain with insidious onset. No trauma or MOI, notes occasionally pain past AC joint. Trouble with OH reaching or reaching behind back.  Hand dominance: Right  PERTINENT HISTORY: None  PAIN:  Are you having pain?  Yes: NPRS scale: 0/10 Worst: 10/10 Pain location: left shoulder  Pain description: sharp  Aggravating factors: movement, OH reaching Relieving factors: none  PRECAUTIONS: None   WEIGHT BEARING RESTRICTIONS: No  FALLS:  Has patient fallen in last 6 months? 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   PLOF: Independent  PATIENT GOALS: decrease left shoulder pain, improve comfort  OBJECTIVE:   DIAGNOSTIC FINDINGS:  See imaging  PATIENT SURVEYS:  FOTO: 55% function; 68% predicted   COGNITION: Overall cognitive status: Within functional limits for tasks assessed     SENSATION: WFL  POSTURE: Rounded shoulder, increased kyphosis  UPPER EXTREMITY ROM:   Active ROM Right eval Left eval  Shoulder flexion WNL 110  Shoulder extension    Shoulder abduction WNL 80  Shoulder adduction    Shoulder internal rotation WNL 20  Shoulder external rotation WNL 35  Elbow flexion    Elbow extension    Wrist flexion    Wrist extension    Wrist ulnar deviation    Wrist radial deviation    Wrist pronation    Wrist supination    (Blank rows = not tested)  UPPER EXTREMITY MMT:  MMT Right eval Left eval  Shoulder flexion 5/5 3/5  Shoulder extension  2+/5  Shoulder abduction 5/5   Shoulder adduction    Shoulder internal rotation 5/5 2+/5  Shoulder external rotation 5/5 2+/5  Middle trapezius    Lower trapezius    Elbow flexion    Elbow extension    Wrist flexion     Wrist extension    Wrist ulnar deviation    Wrist radial deviation    Wrist pronation    Wrist supination    Grip strength (lbs)    (Blank rows = not tested)  SHOULDER SPECIAL TESTS: Impingement tests: DNT SLAP lesions: DNT Instability tests: DNT Rotator cuff assessment: DNT Biceps assessment: DNT  JOINT MOBILITY TESTING:  GH hypomobility  PALPATION:  TTP to L infraspinatus   TREATMENT: [facility] Adult PT Treatment:                                                DATE: 05/12/2023 Therapeutic Exercise: L shoulder IR/ER isometric x 5 - 5" hold Row x 5 GTB Seated bilateral ER x 5 RTB Corner pec stretch x 30"  PATIENT EDUCATION: Education details: eval findings, FOTO, HEP, POC Person educated: Patient Education method: Explanation, Demonstration, and Handouts Education comprehension: verbalized understanding and returned demonstration  HOME EXERCISE PROGRAM: Access Code: [email or web address] URL: [email or web address] Date: 05/12/2023 Prepared by: Edwinna Areola  Exercises - Standing Isometric Shoulder Internal Rotation with Towel Roll at Doorway  - 1 x daily - 7 x weekly - 3 sets - 10 reps - 5 sec hold - Standing Isometric Shoulder External Rotation with Doorway and Towel Roll  - 1 x daily - 7 x weekly - 3 sets - 10 reps - 5 sec hold - Scapular Retraction with Resistance  - 1 x daily - 7 x weekly - 3 sets - 10 reps - green band hold - Shoulder External Rotation and Scapular Retraction with Resistance  - 1 x daily - 7 x weekly - 2 sets - 15 reps - red band hold - Corner Pec Major Stretch  - 1 x daily - 7 x weekly - 2 reps - 30 sec hold  ASSESSMENT:  CLINICAL IMPRESSION: Patient is a  37 y.o. F who was seen today for physical therapy evaluation and treatment for acute on chronic L shoulder pain with no trauma or MOI. Physical findings are consistent with MD impression as pt has decreased L shoulder strength and AROM also well as pain RTC s/s consistent with SAPS. FOTO score  shows she is operating well below PLOF for subjective functional ability of L shoulder. She would benefit from skilled PT services working on improving strength of dynamic stabilizers and postural endurance for decreasing shoulder pain and improving comfort.    OBJECTIVE IMPAIRMENTS: decreased activity tolerance, decreased mobility, decreased ROM, decreased strength, postural dysfunction, and pain.   ACTIVITY LIMITATIONS: carrying, lifting, dressing, and reach over head  PARTICIPATION LIMITATIONS: driving, shopping, community activity, occupation, and yard work  PERSONAL FACTORS: Time since onset of injury/illness/exacerbation are also affecting patient's functional outcome.   REHAB POTENTIAL: Excellent  CLINICAL DECISION MAKING: Stable/uncomplicated  EVALUATION COMPLEXITY: Low   GOALS: Goals reviewed with patient? No  SHORT TERM GOALS: Target date: 06/02/2023   Pt will be compliant and knowledgeable with initial HEP for improved comfort and carryover Baseline: initial HEP given  Goal status: INITIAL  2.  Pt will self report left shoulder pain no greater than 6/10 for improved comfort and functional ability Baseline: 10/10 at worst Goal status: INITIAL   LONG TERM GOALS: Target date: 07/07/2023   Pt will improve FOTO function score to no less than 68% as proxy for functional improvement Baseline: 55% function Goal status: INITIAL   2.   Pt will self report left shoulder pain no greater than 3/10 for improved comfort and functional ability Baseline: 10/10 at worst Goal status: INITIAL  3.  Pt will improve left shoulder flexion AROM to no less than 140 deg for improved functional ability for reaching into cabinets Baseline: see AROM chart Goal status: INITIAL  4.  Pt will improve left shoulder IR/ER MMT to no less than 4/5 for improved dynamic stabilization of shoulder with overhead movements Baseline: see MMT chart Goal status: INITIAL   PLAN:  PT FREQUENCY:  1x/week  PT DURATION: 8 weeks  PLANNED INTERVENTIONS: Therapeutic exercises, Therapeutic activity, Neuromuscular re-education, Balance training, Gait training, Patient/Family education, Self Care, Joint mobilization, Dry Needling, Electrical stimulation, Cryotherapy, Moist heat, Manual therapy, and Re-evaluation  PLAN FOR NEXT SESSION: assess HEP response, dynamic stabilizer strengthening, TPDN   [employer] Authorization   Choose one: Rehabilitative  Standardized Assessment or Functional Outcome Tool: FOTO  Score or Percent Disability: 55% function; 68% predicted   Body Parts Treated (Select each separately):  Shoulder. Overall deficits/functional limitations for body part selected: moderate N/A. Overall deficits/functional limitations for body part selected: N/A N/A. Overall deficits/functional limitations for body part selected: N/A   If treatment provided at initial evaluation, no treatment charged due to lack of authorization.     Eloy End, PT 05/12/2023, 8:42 AM

## 2023-05-25 DIAGNOSIS — Z419 Encounter for procedure for purposes other than remedying health state, unspecified: Secondary | ICD-10-CM | POA: Diagnosis not present

## 2023-06-02 ENCOUNTER — Ambulatory Visit: Payer: Medicaid Other

## 2023-06-04 ENCOUNTER — Encounter: Payer: Self-pay | Admitting: Gastroenterology

## 2023-06-06 ENCOUNTER — Ambulatory Visit: Payer: PRIVATE HEALTH INSURANCE | Admitting: Family Medicine

## 2023-06-10 ENCOUNTER — Ambulatory Visit: Payer: Medicaid Other | Attending: Family Medicine

## 2023-06-10 DIAGNOSIS — G8929 Other chronic pain: Secondary | ICD-10-CM | POA: Diagnosis not present

## 2023-06-10 DIAGNOSIS — M6281 Muscle weakness (generalized): Secondary | ICD-10-CM | POA: Diagnosis not present

## 2023-06-10 DIAGNOSIS — R293 Abnormal posture: Secondary | ICD-10-CM | POA: Diagnosis not present

## 2023-06-10 DIAGNOSIS — M25512 Pain in left shoulder: Secondary | ICD-10-CM | POA: Diagnosis not present

## 2023-06-10 NOTE — Therapy (Signed)
OUTPATIENT PHYSICAL THERAPY TREATMENT NOTE   Patient Name: Garnell Kilburn MRN: [id number] DOB:1985-11-30, 37 y.o., female Today's Date: 06/10/2023  END OF SESSION:  PT End of Session - 06/10/23 0756     Visit Number 2    Number of Visits 9    Date for PT Re-Evaluation 07/07/23    Authorization Type Generic and MCD Secondary    Authorization Time Period Approved 10 visits 05/19/23-07/18/23    PT Start Time 0800    PT Stop Time 0838    PT Time Calculation (min) 38 min    Activity Tolerance Patient tolerated treatment well    Behavior During Therapy [facility] for tasks assessed/performed              Past Medical History:  Diagnosis Date   Allergy    Anxiety    Bartholin cyst    HSV (herpes simplex virus) anogenital infection    Ulcerative colitis (HCC) 2001   when she was in [employer]   Past Surgical History:  Procedure Laterality Date   CESAREAN SECTION N/A 03/13/2020   Procedure: CESAREAN SECTION;  Surgeon: Edwinna Areola, DO;  Location: [facility] LD ORS;  Service: Obstetrics;  Laterality: N/A;   CESAREAN SECTION     CYSTECTOMY     L wrist   Patient Active Problem List   Diagnosis Date Noted   Encounter for annual physical exam 04/21/2023   Ulcerative pancolitis without complication (HCC) 04/21/2023   Indication for care in labor or delivery 03/13/2020   Status post primary low transverse cesarean section 03/13/2020   Pericardial effusion in fetus affecting management of mother 02/09/2020   Bartholin gland cyst 06/23/2019    PCP: Allwardt, Crist Infante, PA-C  REFERRING PROVIDER: Rodolph Bong, MD  REFERRING DIAG: [phone] (ICD-10-CM) - Chronic left shoulder pain   THERAPY DIAG:  Chronic left shoulder pain  Muscle weakness (generalized)  Abnormal posture  Rationale for Evaluation and Treatment: Rehabilitation  ONSET DATE: 5 months  SUBJECTIVE:                                                                                                                                                                                       SUBJECTIVE STATEMENT: Pt presents to PT with reports of continued left shoulder pain. Pt has been compliant with HEP with no adverse effect.   Hand dominance: Right  PERTINENT HISTORY: None  PAIN:  Are you having pain?  Yes: NPRS scale: 0/10 Worst: 10/10 Pain location: left shoulder  Pain description: sharp Aggravating factors: movement, OH reaching Relieving factors: none  PRECAUTIONS: None   WEIGHT BEARING RESTRICTIONS: No  FALLS:  Has patient fallen  in last 6 months? 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   PLOF: Independent  PATIENT GOALS: decrease left shoulder pain, improve comfort  OBJECTIVE:   DIAGNOSTIC FINDINGS:  See imaging  PATIENT SURVEYS:  FOTO: 55% function; 68% predicted   COGNITION: Overall cognitive status: Within functional limits for tasks assessed     SENSATION: WFL  POSTURE: Rounded shoulder, increased kyphosis  UPPER EXTREMITY ROM:   Active ROM Right eval Left eval  Shoulder flexion WNL 110  Shoulder extension    Shoulder abduction WNL 80  Shoulder adduction    Shoulder internal rotation WNL 20  Shoulder external rotation WNL 35  Elbow flexion    Elbow extension    Wrist flexion    Wrist extension    Wrist ulnar deviation    Wrist radial deviation    Wrist pronation    Wrist supination    (Blank rows = not tested)  UPPER EXTREMITY MMT:  MMT Right eval Left eval  Shoulder flexion 5/5 3/5  Shoulder extension  2+/5  Shoulder abduction 5/5   Shoulder adduction    Shoulder internal rotation 5/5 2+/5  Shoulder external rotation 5/5 2+/5  Middle trapezius    Lower trapezius    Elbow flexion    Elbow extension    Wrist flexion    Wrist extension    Wrist ulnar deviation    Wrist radial deviation    Wrist pronation    Wrist supination    Grip strength  (lbs)    (Blank rows = not tested)  SHOULDER SPECIAL TESTS: Impingement tests: DNT SLAP lesions: DNT Instability tests: DNT Rotator cuff assessment: DNT Biceps assessment: DNT  JOINT MOBILITY TESTING:  GH hypomobility  PALPATION:  TTP to L infraspinatus   TREATMENT: [facility] Adult PT Treatment:                                                DATE: 06/10/2023 Therapeutic Exercise: UBE lvl 1.0 x 3 min while taking subjective Row 3x10 17# Shoulder ext 2x10 17# Supine horizontal abd 2x15 GTB Supine D2 flexion YTB 2x10 L Seated bilateral ER 2x10 GTB Sidelying shoulder abd 2x10 L Sidelying shoulder ER 2x10 2# Supine pec stretch with towel x 60" L UE doorway pec stretch 2x30"Corner pec stretch x 30" Pulleys shoulder flex x 2 min  [facility] Adult PT Treatment:                                                DATE: 05/12/2023 Therapeutic Exercise: L shoulder IR/ER isometric x 5 - 5" hold Row x 5 GTB Seated bilateral ER x 5 RTB Corner pec stretch x 30"  PATIENT EDUCATION: Education details: eval findings, FOTO, HEP, POC Person educated: Patient Education method: Explanation, Demonstration, and Handouts Education comprehension: verbalized understanding and returned demonstration  HOME EXERCISE PROGRAM: Access Code: [email or web address] URL: [email or web address] Date: 05/12/2023 Prepared by: Edwinna Areola  Exercises - Standing Isometric Shoulder Internal Rotation with Towel Roll at Doorway  - 1 x daily - 7 x weekly - 3 sets - 10 reps - 5 sec hold - Standing Isometric Shoulder External Rotation with Doorway and Towel Roll  -  1 x daily - 7 x weekly - 3 sets - 10 reps - 5 sec hold - Scapular Retraction with Resistance  - 1 x daily - 7 x weekly - 3 sets - 10 reps - green band hold - Shoulder External Rotation and Scapular Retraction with Resistance  - 1 x daily - 7 x weekly - 2 sets - 15 reps - red band hold - Corner Pec Major Stretch  - 1 x daily - 7 x weekly - 2 reps - 30 sec  hold  ASSESSMENT:  CLINICAL IMPRESSION: Pt was able to complete all prescribed exercises with no adverse effect. Therapy today continued to progress L shoulder strength and ROM. HEP updated for continued RTC and periscapular strength. Will continue to progress as able per POC.   OBJECTIVE IMPAIRMENTS: decreased activity tolerance, decreased mobility, decreased ROM, decreased strength, postural dysfunction, and pain.   ACTIVITY LIMITATIONS: carrying, lifting, dressing, and reach over head  PARTICIPATION LIMITATIONS: driving, shopping, community activity, occupation, and yard work  PERSONAL FACTORS: Time since onset of injury/illness/exacerbation are also affecting patient's functional outcome.   REHAB POTENTIAL: Excellent  CLINICAL DECISION MAKING: Stable/uncomplicated  EVALUATION COMPLEXITY: Low   GOALS: Goals reviewed with patient? No  SHORT TERM GOALS: Target date: 06/02/2023   Pt will be compliant and knowledgeable with initial HEP for improved comfort and carryover Baseline: initial HEP given  Goal status: INITIAL  2.  Pt will self report left shoulder pain no greater than 6/10 for improved comfort and functional ability Baseline: 10/10 at worst Goal status: INITIAL   LONG TERM GOALS: Target date: 07/07/2023   Pt will improve FOTO function score to no less than 68% as proxy for functional improvement Baseline: 55% function Goal status: INITIAL   2.   Pt will self report left shoulder pain no greater than 3/10 for improved comfort and functional ability Baseline: 10/10 at worst Goal status: INITIAL  3.  Pt will improve left shoulder flexion AROM to no less than 140 deg for improved functional ability for reaching into cabinets Baseline: see AROM chart Goal status: INITIAL  4.  Pt will improve left shoulder IR/ER MMT to no less than 4/5 for improved dynamic stabilization of shoulder with overhead movements Baseline: see MMT chart Goal status:  INITIAL   PLAN:  PT FREQUENCY: 1x/week  PT DURATION: 8 weeks  PLANNED INTERVENTIONS: Therapeutic exercises, Therapeutic activity, Neuromuscular re-education, Balance training, Gait training, Patient/Family education, Self Care, Joint mobilization, Dry Needling, Electrical stimulation, Cryotherapy, Moist heat, Manual therapy, and Re-evaluation  PLAN FOR NEXT SESSION: assess HEP response, dynamic stabilizer strengthening, TPDN   Eloy End, PT 06/10/2023, 8:41 AM

## 2023-06-19 ENCOUNTER — Ambulatory Visit: Payer: Medicaid Other | Admitting: Physical Therapy

## 2023-06-19 ENCOUNTER — Other Ambulatory Visit: Payer: Self-pay

## 2023-06-19 ENCOUNTER — Encounter: Payer: Self-pay | Admitting: Physical Therapy

## 2023-06-19 DIAGNOSIS — R293 Abnormal posture: Secondary | ICD-10-CM | POA: Diagnosis not present

## 2023-06-19 DIAGNOSIS — G8929 Other chronic pain: Secondary | ICD-10-CM | POA: Diagnosis not present

## 2023-06-19 DIAGNOSIS — M6281 Muscle weakness (generalized): Secondary | ICD-10-CM | POA: Diagnosis not present

## 2023-06-19 DIAGNOSIS — M25512 Pain in left shoulder: Secondary | ICD-10-CM | POA: Diagnosis not present

## 2023-06-19 NOTE — Therapy (Addendum)
 OUTPATIENT PHYSICAL THERAPY TREATMENT NOTE  DISCHARGE   Patient Name: Rachel Hanson MRN: [id number] DOB:03-11-1986, 37 y.o., female Today's Date: 06/19/2023   END OF SESSION:  PT End of Session - 06/19/23 0825     Visit Number 3    Number of Visits 9    Date for PT Re-Evaluation 07/07/23    Authorization Type MCD [employer]    Authorization Time Period 05/19/23 - 07/18/23    Authorization - Visit Number 2    Authorization - Number of Visits 10    PT Start Time 0800    PT Stop Time 0840    PT Time Calculation (min) 40 min    Activity Tolerance Patient tolerated treatment well    Behavior During Therapy WFL for tasks assessed/performed               Past Medical History:  Diagnosis Date   Allergy    Anxiety    Bartholin cyst    HSV (herpes simplex virus) anogenital infection    Ulcerative colitis (HCC) 2001   when she was in [employer]   Past Surgical History:  Procedure Laterality Date   CESAREAN SECTION N/A 03/13/2020   Procedure: CESAREAN SECTION;  Surgeon: Edwinna Areola, DO;  Location: [facility] LD ORS;  Service: Obstetrics;  Laterality: N/A;   CESAREAN SECTION     CYSTECTOMY     L wrist   Patient Active Problem List   Diagnosis Date Noted   Encounter for annual physical exam 04/21/2023   Ulcerative pancolitis without complication (HCC) 04/21/2023   Indication for care in labor or delivery 03/13/2020   Status post primary low transverse cesarean section 03/13/2020   Pericardial effusion in fetus affecting management of mother 02/09/2020   Bartholin gland cyst 06/23/2019    PCP: Allwardt, Crist Infante, PA-C  REFERRING PROVIDER: Rodolph Bong, MD  REFERRING DIAG: [phone] (ICD-10-CM) - Chronic left shoulder pain   THERAPY DIAG:  Chronic left shoulder pain  Muscle weakness (generalized)  Abnormal posture  Rationale for Evaluation and Treatment: Rehabilitation  ONSET DATE: 5 months   SUBJECTIVE:                                                                                                                                                                                      SUBJECTIVE STATEMENT: Patient states her shoulder is not any worse, but since that last visit she has felt more tenderness pain possibly from the exercises she has been doing. She does report better mobility of the left shoulder prior to any pain, but it is still severe when she gets the pain.   Hand dominance: Right  PERTINENT  HISTORY: None  PAIN:  Are you having pain?  Yes: NPRS scale: 0/10 at rest Worst: 10/10 Pain location: left shoulder  Pain description: sharp Aggravating factors: movement, OH reaching Relieving factors: none  PRECAUTIONS: None  PATIENT GOALS: decrease left shoulder pain, improve comfort   OBJECTIVE:  PATIENT SURVEYS:  FOTO: 55% function; 68% predicted   POSTURE: Rounded shoulder, increased kyphosis  UPPER EXTREMITY ROM:   Active ROM Right eval Left eval Left 06/19/2023  Shoulder flexion WNL 110 120  Shoulder extension     Shoulder abduction WNL 80 95  Shoulder adduction     Shoulder internal rotation WNL 20   Shoulder external rotation WNL 35   Elbow flexion     Elbow extension     Wrist flexion     Wrist extension     Wrist ulnar deviation     Wrist radial deviation     Wrist pronation     Wrist supination     (Blank rows = not tested)  UPPER EXTREMITY MMT:  MMT Right eval Left eval  Shoulder flexion 5/5 3/5  Shoulder extension  2+/5  Shoulder abduction 5/5   Shoulder adduction    Shoulder internal rotation 5/5 2+/5  Shoulder external rotation 5/5 2+/5  Middle trapezius    Lower trapezius    Elbow flexion    Elbow extension    Wrist flexion    Wrist extension    Wrist ulnar deviation    Wrist radial deviation    Wrist pronation    Wrist supination    Grip strength (lbs)    (Blank rows = not tested)  SHOULDER SPECIAL TESTS: Impingement tests: DNT SLAP lesions: DNT Instability tests:  DNT Rotator cuff assessment: DNT Biceps assessment: DNT  JOINT MOBILITY TESTING:  GH hypomobility  PALPATION:  TTP to L infraspinatus    TREATMENT: [facility] Adult PT Treatment:                                                DATE: 06/19/2023 Therapeutic Exercise: UBE L1 x 4 min (fwd/bwd) while taking subjective Seated table slide 5 x 10 sec Shoulder ER stretch in doorframe at 0 and 90 deg abduction 3 x 20 sec each Supine dowel flexion x 10 Sidelying shoulder ER with 2# 2 x 10 Sidelying shoulder abduction with 2# 2 x 10 Standing step back stretch at counter 5 x 10 sec Manual: Left GHJ mobs all directions at various ranges of elevation   [facility] Adult PT Treatment:                                                DATE: 06/10/2023 Therapeutic Exercise: UBE lvl 1.0 x 3 min while taking subjective Row 3x10 17# Shoulder ext 2x10 17# Supine horizontal abd 2x15 GTB Supine D2 flexion YTB 2x10 L Seated bilateral ER 2x10 GTB Sidelying shoulder abd 2x10 L Sidelying shoulder ER 2x10 2# Supine pec stretch with towel x 60" L UE doorway pec stretch 2x30"Corner pec stretch x 30" Pulleys shoulder flex x 2 min  [facility] Adult PT Treatment:  DATE: 05/12/2023 Therapeutic Exercise: L shoulder IR/ER isometric x 5 - 5" hold Row x 5 GTB Seated bilateral ER x 5 RTB Corner pec stretch x 30"  PATIENT EDUCATION: Education details: HEP Person educated: Patient Education method: Programmer, multimedia, Facilities manager, and Handouts Education comprehension: verbalized understanding and returned demonstration  HOME EXERCISE PROGRAM: Access Code: [email or web address] URL: [email or web address] Date: 05/12/2023 Prepared by: Edwinna Areola  Exercises - Standing Isometric Shoulder Internal Rotation with Towel Roll at Doorway  - 1 x daily - 7 x weekly - 3 sets - 10 reps - 5 sec hold - Standing Isometric Shoulder External Rotation with Doorway and Towel Roll  - 1 x daily - 7 x  weekly - 3 sets - 10 reps - 5 sec hold - Scapular Retraction with Resistance  - 1 x daily - 7 x weekly - 3 sets - 10 reps - green band hold - Shoulder External Rotation and Scapular Retraction with Resistance  - 1 x daily - 7 x weekly - 2 sets - 15 reps - red band hold - Corner Pec Major Stretch  - 1 x daily - 7 x weekly - 2 reps - 30 sec hold   ASSESSMENT: CLINICAL IMPRESSION: Patient tolerated therapy well with with no adverse effects. Therapy focused on progressing shoulder mobility and range of motion with good tolerance. She does exhibit improvement in her active shoulder elevation but overall remains limited with her motion and hypomobility of the GHJ joint that is likely indicate more of adhesive capsulitis rather than rotator cuff related symptoms. Updated her HEP to progress her shoulder stretching with good tolerance. Patient would benefit from continued skilled PT to progress her shoulder mobility and strength in order to reduce pain and maximize functional ability.     OBJECTIVE IMPAIRMENTS: decreased activity tolerance, decreased mobility, decreased ROM, decreased strength, postural dysfunction, and pain.   ACTIVITY LIMITATIONS: carrying, lifting, dressing, and reach over head  PARTICIPATION LIMITATIONS: driving, shopping, community activity, occupation, and yard work  PERSONAL FACTORS: Time since onset of injury/illness/exacerbation are also affecting patient's functional outcome.    GOALS: Goals reviewed with patient? No  SHORT TERM GOALS: Target date: 06/02/2023   Pt will be compliant and knowledgeable with initial HEP for improved comfort and carryover Baseline: initial HEP given  06/19/2023: independent Goal status: MET  2.  Pt will self report left shoulder pain no greater than 6/10 for improved comfort and functional ability Baseline: 10/10 at worst 06/19/2023: 10/10 at worst Goal status: ONGOING  LONG TERM GOALS: Target date: 07/07/2023   Pt will improve FOTO  function score to no less than 68% as proxy for functional improvement Baseline: 55% function Goal status: INITIAL   2.   Pt will self report left shoulder pain no greater than 3/10 for improved comfort and functional ability Baseline: 10/10 at worst Goal status: INITIAL  3.  Pt will improve left shoulder flexion AROM to no less than 140 deg for improved functional ability for reaching into cabinets Baseline: see AROM chart Goal status: INITIAL  4.  Pt will improve left shoulder IR/ER MMT to no less than 4/5 for improved dynamic stabilization of shoulder with overhead movements Baseline: see MMT chart Goal status: INITIAL   PLAN: PT FREQUENCY: 1x/week  PT DURATION: 8 weeks  PLANNED INTERVENTIONS: Therapeutic exercises, Therapeutic activity, Neuromuscular re-education, Balance training, Gait training, Patient/Family education, Self Care, Joint mobilization, Dry Needling, Electrical stimulation, Cryotherapy, Moist heat, Manual therapy, and Re-evaluation  PLAN FOR NEXT SESSION: assess HEP response, dynamic stabilizer  strengthening, TPDN    Rosana Hoes, PT, DPT, LAT, ATC 06/19/23  9:05 AM Phone: [phone] Fax: [phone]    PHYSICAL THERAPY DISCHARGE SUMMARY  Visits from Start of Care: 3  Current functional level related to goals / functional outcomes: See above   Remaining deficits: See above   Education / Equipment: HEP   Patient agrees to discharge. Patient goals were partially met. Patient is being discharged due to not returning since the last visit.  Rosana Hoes, PT, DPT, LAT, ATC 12/01/23  10:49 AM Phone: [phone] Fax: [phone]

## 2023-06-24 DIAGNOSIS — Z419 Encounter for procedure for purposes other than remedying health state, unspecified: Secondary | ICD-10-CM | POA: Diagnosis not present

## 2023-07-10 ENCOUNTER — Encounter: Payer: Self-pay | Admitting: Physician Assistant

## 2023-07-25 DIAGNOSIS — Z419 Encounter for procedure for purposes other than remedying health state, unspecified: Secondary | ICD-10-CM | POA: Diagnosis not present

## 2023-08-11 ENCOUNTER — Encounter: Payer: Self-pay | Admitting: Gastroenterology

## 2023-08-11 ENCOUNTER — Ambulatory Visit (INDEPENDENT_AMBULATORY_CARE_PROVIDER_SITE_OTHER): Payer: PRIVATE HEALTH INSURANCE | Admitting: Gastroenterology

## 2023-08-11 VITALS — BP 112/68 | HR 97 | Ht 63.0 in | Wt 141.0 lb

## 2023-08-11 DIAGNOSIS — K625 Hemorrhage of anus and rectum: Secondary | ICD-10-CM | POA: Diagnosis not present

## 2023-08-11 DIAGNOSIS — K51919 Ulcerative colitis, unspecified with unspecified complications: Secondary | ICD-10-CM | POA: Diagnosis not present

## 2023-08-11 MED ORDER — NA SULFATE-K SULFATE-MG SULF 17.5-3.13-1.6 GM/177ML PO SOLN: 1.0000 | Freq: Once | ORAL | 0 refills | Status: AC

## 2023-08-11 NOTE — Progress Notes (Signed)
I agree with the assessment and plan as outlined by [name]. 

## 2023-08-11 NOTE — Patient Instructions (Signed)
You have been scheduled for a colonoscopy. Please follow written instructions given to you at your visit today.   Please pick up your prep supplies at the pharmacy within the next 1-3 days.  If you use inhalers (even only as needed), please bring them with you on the day of your procedure.  DO NOT TAKE 7 DAYS PRIOR TO TEST- Trulicity (dulaglutide) Ozempic, Wegovy (semaglutide) Mounjaro (tirzepatide) Bydureon Bcise (exanatide extended release)  DO NOT TAKE 1 DAY PRIOR TO YOUR TEST Rybelsus (semaglutide) Adlyxin (lixisenatide) Victoza (liraglutide) Byetta (exanatide) _____________________________________________________________________  _______________________________________________________  If your blood pressure at your visit was 140/90 or greater, please contact your primary care physician to follow up on this.  _______________________________________________________  If you are age 26 or older, your body mass index should be between 23-30. Your Body mass index is 24.98 kg/m. If this is out of the aforementioned range listed, please consider follow up with your Primary Care Provider.  If you are age 38 or younger, your body mass index should be between 19-25. Your Body mass index is 24.98 kg/m. If this is out of the aformentioned range listed, please consider follow up with your Primary Care Provider.   ________________________________________________________  The [facility] providers would like to encourage you to use [facility] to communicate with providers for non-urgent requests or questions.  Due to long hold times on the telephone, sending your provider a message by [facility] may be a faster and more efficient way to get a response.  Please allow 48 business hours for a response.  Please remember that this is for non-urgent requests.  _______________________________________________________

## 2023-08-11 NOTE — Progress Notes (Signed)
08/11/2023 Rachel Hanson [id number] [birth year]   HISTORY OF PRESENT ILLNESS: This is a 37 year old female who establish care with Dr. Orvan Falconer in March 2021.  Has a history of ulcerative colitis diagnosed at age 50 in [place].  Treated initially with sulfasalazine x 1 year, but then really no GI follow-up since then.  Has intermittent low-volume rectal bleeding.  No significant diarrhea.  Was here in 2021, but was pregnant.  Found to have an anal fissure on exam and that was treated.  Sed rate, CRP, fecal calprotectin were normal at that time.  She says that her PCP sent her back here for follow-up.  They are considering having another child so may end up becoming pregnant sometime soon and just wanted to get this checked out beforehand.  Recent basic labs by PCP look good.  She is adopted so her biologic family history is not known.  Referred here on this occasion by Alyssa Allwardt, PA-C, for ulcerative pancolitis.   Past Medical History:  Diagnosis Date   Allergy    Anxiety    Bartholin cyst    High cholesterol    HSV (herpes simplex virus) anogenital infection    Low vitamin D level    Ulcerative colitis (HCC) 2001   when she was in [employer]   Past Surgical History:  Procedure Laterality Date   CESAREAN SECTION N/A 03/13/2020   Procedure: CESAREAN SECTION;  Surgeon: Edwinna Areola, DO;  Location: [facility];  Service: Obstetrics;  Laterality: N/A;   CESAREAN SECTION     CYSTECTOMY     L wrist    reports that she quit smoking about 3 years ago. Her smoking use included cigarettes. She started smoking about 14 years ago. She has a 5.5 pack-year smoking history. She has never used smokeless tobacco. She reports current alcohol use of about 1.0 standard drink of alcohol per week. She reports that she does not currently use drugs after having used the following drugs: Marijuana. family history is not on file. She was adopted. Allergies  Allergen Reactions   Bactrim  [Sulfamethoxazole-Trimethoprim]     2 episodes of facial swelling, rash following initiation of Bactrim   Sulfamethoxazole Hives, Swelling and Other (See Comments)    Face redness       Outpatient Encounter Medications as of 08/11/2023  Medication Sig   acetaminophen (TYLENOL) 325 MG tablet Take 2 tablets (650 mg total) by mouth every 6 (six) hours as needed for moderate pain or fever.   ASHWAGANDHA GUMMIES PO Take by mouth.   ibuprofen (ADVIL) 200 MG tablet Take 200 mg by mouth every 6 (six) hours as needed for mild pain.   Magnesium Glycinate 100 MG CAPS Take by mouth.   Vitamin D, Ergocalciferol, (DRISDOL) 1.25 MG (50000 UNIT) CAPS capsule Take 1 capsule (50,000 Units total) by mouth every 7 (seven) days.   [DISCONTINUED] AMBULATORY NON FORMULARY MEDICATION Medication Name: Nitroglycerin 0.125 ointment three times a day apply rectally up until the first knuckle after fingernail for 6-8 weeks.   [DISCONTINUED] ibuprofen (ADVIL) 600 MG tablet Take 1 tablet (600 mg total) by mouth every 6 (six) hours as needed for moderate pain.   [DISCONTINUED] meclizine (ANTIVERT) 25 MG tablet Take 1 tablet (25 mg total) by mouth 3 (three) times daily as needed for dizziness.   [DISCONTINUED] oxyCODONE (OXY IR/ROXICODONE) 5 MG immediate release tablet Take 1-2 tablets (5-10 mg total) by mouth every 6 (six) hours as needed for moderate pain or severe  pain.   [DISCONTINUED] Prenatal Vit-Fe Fumarate-FA (MULTIVITAMIN-PRENATAL) 27-0.8 MG TABS tablet Take 2 tablets by mouth daily at 12 noon.   [DISCONTINUED] SLYND 4 MG TABS Take 1 tablet by mouth daily.   No facility-administered encounter medications on file as of 08/11/2023.    REVIEW OF SYSTEMS  : All other systems reviewed and negative except where noted in the History of Present Illness.   PHYSICAL EXAM: BP 112/68   Pulse 97   Ht 5\' 3"  (1.6 m)   Wt 141 lb (64 kg)   BMI 24.98 kg/m  General: Well developed Asian female in no acute distress Head:  Normocephalic and atraumatic Eyes:  Sclerae anicteric, conjunctiva pink. Ears: Normal auditory acuity Lungs: Clear throughout to auscultation; no W/R/R. Heart: Regular rate and rhythm; no M/R/G. Abdomen: Soft, non-distended.  BS present.  Non-tender. Rectal:  Will be done at the time of colonoscopy. Musculoskeletal: Symmetrical with no gross deformities  Skin: No lesions on visible extremities Extremities: No edema  Neurological: Alert oriented x 4, grossly non-focal Psychological:  Alert and cooperative. Normal mood and affect  ASSESSMENT AND PLAN: *History of ulcerative colitis diagnosed at age 37 in [place].  Treated initially with sulfasalazine x 1 year, but then really no GI follow-up since then.  Has intermittent low-volume rectal bleeding.  No significant diarrhea.  Was here in 2021, but was pregnant.  Found to have an anal fissure on exam and that was treated.  Sed rate, CRP, fecal calprotectin were normal at that time.  Will plan for colonoscopy with Dr. Leonides Schanz this to reevaluate.  If no evidence of active disease likely can just proceed with observation.  The risks, benefits, and alternatives to colonoscopy were discussed with the patient and she consents to proceed.   CC:  Allwardt, Crist Infante, PA-C

## 2023-08-15 ENCOUNTER — Ambulatory Visit: Payer: PRIVATE HEALTH INSURANCE | Admitting: Gastroenterology

## 2023-08-24 DIAGNOSIS — Z419 Encounter for procedure for purposes other than remedying health state, unspecified: Secondary | ICD-10-CM | POA: Diagnosis not present

## 2023-09-18 ENCOUNTER — Telehealth: Payer: Self-pay | Admitting: Gastroenterology

## 2023-09-18 NOTE — Telephone Encounter (Signed)
Patient called stated she is allergic to Bactrim and would like to know if her prep medication would be okay to take for procedure tomorrow

## 2023-09-18 NOTE — Telephone Encounter (Signed)
Spoke with patient and rescheduled procedure for Monday 09/22/23 @ 3 pm. Sent new instructions via Mychart. Patient picked up prep from pharmacy today.

## 2023-09-18 NOTE — Telephone Encounter (Signed)
Inbound call from patient requesting a call regarding previous notes. Please advise, thank you.

## 2023-09-18 NOTE — Telephone Encounter (Signed)
Inbound call from patient stating she also ate today at 12 pm. Requesting a call to be advised further. Please advise, thank you.

## 2023-09-19 ENCOUNTER — Encounter: Payer: No Typology Code available for payment source | Admitting: Internal Medicine

## 2023-09-22 ENCOUNTER — Ambulatory Visit: Payer: No Typology Code available for payment source | Admitting: Internal Medicine

## 2023-09-22 ENCOUNTER — Encounter: Payer: Self-pay | Admitting: Internal Medicine

## 2023-09-22 VITALS — BP 107/64 | HR 67 | Temp 98.2°F | Resp 19 | Ht 63.0 in | Wt 141.0 lb

## 2023-09-22 DIAGNOSIS — K51919 Ulcerative colitis, unspecified with unspecified complications: Secondary | ICD-10-CM

## 2023-09-22 DIAGNOSIS — K648 Other hemorrhoids: Secondary | ICD-10-CM | POA: Diagnosis not present

## 2023-09-22 DIAGNOSIS — D123 Benign neoplasm of transverse colon: Secondary | ICD-10-CM

## 2023-09-22 DIAGNOSIS — F419 Anxiety disorder, unspecified: Secondary | ICD-10-CM | POA: Diagnosis not present

## 2023-09-22 DIAGNOSIS — K6289 Other specified diseases of anus and rectum: Secondary | ICD-10-CM | POA: Diagnosis not present

## 2023-09-22 DIAGNOSIS — K6389 Other specified diseases of intestine: Secondary | ICD-10-CM | POA: Diagnosis not present

## 2023-09-22 DIAGNOSIS — E7800 Pure hypercholesterolemia, unspecified: Secondary | ICD-10-CM | POA: Diagnosis not present

## 2023-09-22 DIAGNOSIS — K635 Polyp of colon: Secondary | ICD-10-CM | POA: Diagnosis present

## 2023-09-22 MED ORDER — SODIUM CHLORIDE 0.9 % IV SOLN
500.0000 mL | Freq: Once | INTRAVENOUS | Status: DC
Start: 2023-09-22 — End: 2023-09-22

## 2023-09-22 NOTE — Op Note (Signed)
[facility] Patient Name: Rachel Hanson Procedure Date: 09/22/2023 3:20 PM MRN: [id number] Endoscopist: Madelyn Brunner [place] , , [id number] Age: 37 Referring MD:  Date of Birth: 1986/07/31 Gender: Female Account #: [id number] Procedure:                Colonoscopy Indications:              Follow-up of ulcerative colitis Medicines:                Monitored Anesthesia Care Procedure:                Pre-Anesthesia Assessment:                           - Prior to the procedure, a History and Physical                            was performed, and patient medications and                            allergies were reviewed. The patient's tolerance of                            previous anesthesia was also reviewed. The risks                            and benefits of the procedure and the sedation                            options and risks were discussed with the patient.                            All questions were answered, and informed consent                            was obtained. Prior Anticoagulants: The patient has                            taken no anticoagulant or antiplatelet agents. ASA                            Grade Assessment: II - A patient with mild systemic                            disease. After reviewing the risks and benefits,                            the patient was deemed in satisfactory condition to                            undergo the procedure.                           After obtaining informed consent, the colonoscope  was passed under direct vision. Throughout the                            procedure, the patient's blood pressure, pulse, and                            oxygen saturations were monitored continuously. The                            Olympus Scope SN: [id number] was introduced through                            the anus and advanced to the the terminal ileum.                            The colonoscopy was  performed without difficulty.                            The patient tolerated the procedure well. The                            quality of the bowel preparation was excellent. The                            terminal ileum, ileocecal valve, appendiceal                            orifice, and rectum were photographed. Scope In: 3:32:48 PM Scope Out: 3:51:53 PM Scope Withdrawal Time: 0 hours 16 minutes 2 seconds  Total Procedure Duration: 0 hours 19 minutes 5 seconds  Findings:                 The terminal ileum appeared normal.                           Biopsies were taken with a cold forceps of the                            right and left colon for histology and to evaluate                            for signs of dysplasia.                           A 4 mm polyp was found in the transverse colon. The                            polyp was sessile. The polyp was removed with a                            cold snare. Resection and retrieval were complete.                           Inflammation characterized by congestion (  edema),                            erosions and erythema was found in a continuous and                            circumferential pattern in the rectum. Biopsies                            were taken with a cold forceps for histology.                           Non-bleeding internal hemorrhoids were found during                            retroflexion. Complications:            No immediate complications. Estimated Blood Loss:     Estimated blood loss was minimal. Impression:               - The examined portion of the ileum was normal.                           - One 4 mm polyp in the transverse colon, removed                            with a cold snare. Resected and retrieved.                           - Biopsies were taken with a cold forceps for                            histology in the right and left colon.                           - Inflammation was found in the  rectum. Biopsied.                           - Non-bleeding internal hemorrhoids. Recommendation:           - Discharge patient to home (with escort).                           - Await pathology results.                           - The findings and recommendations were discussed                            with the patient. Dr Particia Lather "Alan Ripper" Leonides Schanz,  09/22/2023 4:01:46 PM

## 2023-09-22 NOTE — Patient Instructions (Signed)
 Educational handout provided to patient related to Hemorrhoids and Polyps  Resume previous diet  Continue present medications  Awaiting pathology results   YOU HAD AN ENDOSCOPIC PROCEDURE TODAY AT THE [facility]:   Refer to the procedure report that was given to you for any specific questions about what was found during the examination.  If the procedure report does not answer your questions, please call your gastroenterologist to clarify.  If you requested that your care partner not be given the details of your procedure findings, then the procedure report has been included in a sealed envelope for you to review at your convenience later.  YOU SHOULD EXPECT: Some feelings of bloating in the abdomen. Passage of more gas than usual.  Walking can help get rid of the air that was put into your GI tract during the procedure and reduce the bloating. If you had a lower endoscopy (such as a colonoscopy or flexible sigmoidoscopy) you may notice spotting of blood in your stool or on the toilet paper. If you underwent a bowel prep for your procedure, you may not have a normal bowel movement for a few days.  Please Note:  You might notice some irritation and congestion in your nose or some drainage.  This is from the oxygen used during your procedure.  There is no need for concern and it should clear up in a day or so.  SYMPTOMS TO REPORT IMMEDIATELY:  Following lower endoscopy (colonoscopy or flexible sigmoidoscopy):  Excessive amounts of blood in the stool  Significant tenderness or worsening of abdominal pains  Swelling of the abdomen that is new, acute  Fever of 100F or higher  For urgent or emergent issues, a gastroenterologist can be reached at any hour by calling [phone]. Do not use MyChart messaging for urgent concerns.    DIET:  We do recommend a small meal at first, but then you may proceed to your regular diet.  Drink plenty of fluids but you should avoid alcoholic  beverages for 24 hours.  ACTIVITY:  You should plan to take it easy for the rest of today and you should NOT DRIVE or use heavy machinery until tomorrow (because of the sedation medicines used during the test).    FOLLOW UP: Our staff will call the number listed on your records the next business day following your procedure.  We will call around 7:15- 8:00 am to check on you and address any questions or concerns that you may have regarding the information given to you following your procedure. If we do not reach you, we will leave a message.     If any biopsies were taken you will be contacted by phone or by letter within the next 1-3 weeks.  Please call us at [phone] if you have not heard about the biopsies in 3 weeks.    SIGNATURES/CONFIDENTIALITY: You and/or your care partner have signed paperwork which will be entered into your electronic medical record.  These signatures attest to the fact that that the information above on your After Visit Summary has been reviewed and is understood.  Full responsibility of the confidentiality of this discharge information lies with you and/or your care-partner.

## 2023-09-22 NOTE — Progress Notes (Signed)
To pacu, VSS. Report to Rn,tb

## 2023-09-22 NOTE — Progress Notes (Signed)
GASTROENTEROLOGY PROCEDURE H&P NOTE   Primary Care Physician: Allwardt, Crist Infante, PA-C    Reason for Procedure:   Ulcerative colitis  Plan:    Colonoscopy  Patient is appropriate for endoscopic procedure(s) in the ambulatory ([facility]) setting.  The nature of the procedure, as well as the risks, benefits, and alternatives were carefully and thoroughly reviewed with the patient. Ample time for discussion and questions allowed. The patient understood, was satisfied, and agreed to proceed.     HPI: Rachel Hanson is a 37 y.o. female who presents for colonoscopy for evaluation of ulcerative colitis .  Patient was most recently seen in the Gastroenterology Clinic on 08/11/23.  No interval change in medical history since that appointment. Please refer to that note for full details regarding GI history and clinical presentation.   Past Medical History:  Diagnosis Date   Allergy    Anxiety    Bartholin cyst    High cholesterol    HSV (herpes simplex virus) anogenital infection    Low vitamin D level    Ulcerative colitis (HCC) 2001   when she was in [employer]    Past Surgical History:  Procedure Laterality Date   CESAREAN SECTION N/A 03/13/2020   Procedure: CESAREAN SECTION;  Surgeon: Edwinna Areola, DO;  Location: [facility];  Service: Obstetrics;  Laterality: N/A;   CESAREAN SECTION     CYSTECTOMY     L wrist    Prior to Admission medications   Medication Sig Start Date End Date Taking? Authorizing Provider  Vitamin D, Ergocalciferol, (DRISDOL) 1.25 MG (50000 UNIT) CAPS capsule Take 1 capsule (50,000 Units total) by mouth every 7 (seven) days. 04/25/23  Yes Allwardt, Alyssa M, PA-C  acetaminophen (TYLENOL) 325 MG tablet Take 2 tablets (650 mg total) by mouth every 6 (six) hours as needed for moderate pain or fever. 06/23/19   Calvert Cantor, CNM  ASHWAGANDHA GUMMIES PO Take by mouth.    [provider]  ibuprofen (ADVIL) 200 MG tablet Take 200 mg by mouth  every 6 (six) hours as needed for mild pain.    [provider]  Magnesium Glycinate 100 MG CAPS Take by mouth.    [provider]    Current Outpatient Medications  Medication Sig Dispense Refill   Vitamin D, Ergocalciferol, (DRISDOL) 1.25 MG (50000 UNIT) CAPS capsule Take 1 capsule (50,000 Units total) by mouth every 7 (seven) days. 4 capsule 2   acetaminophen (TYLENOL) 325 MG tablet Take 2 tablets (650 mg total) by mouth every 6 (six) hours as needed for moderate pain or fever. 30 tablet 3   ASHWAGANDHA GUMMIES PO Take by mouth.     ibuprofen (ADVIL) 200 MG tablet Take 200 mg by mouth every 6 (six) hours as needed for mild pain.     Magnesium Glycinate 100 MG CAPS Take by mouth.     Current Facility-Administered Medications  Medication Dose Route Frequency Provider Last Rate Last Admin   0.9 %  sodium chloride infusion  500 mL Intravenous Once Imogene Burn, MD        Allergies as of 09/22/2023 - Review Complete 09/22/2023  Allergen Reaction Noted   Sulfamethoxazole Hives, Swelling, and Other (See Comments) 04/21/2023   Sulfamethoxazole-trimethoprim Swelling 06/23/2019   Misc. sulfonamide containing compounds Rash 09/22/2023    Family History  Adopted: Yes  Family history unknown: Yes    Social History   Socioeconomic History   Marital status: Married    Spouse name:  Not on file   Number of children: 1   Years of education: Not on file   Highest education level: Not on file  Occupational History   Occupation: work from home  Tobacco Use   Smoking status: Former    Current packs/day: 0.00    Average packs/day: 0.5 packs/day for 11.0 years (5.5 ttl pk-yrs)    Types: Cigarettes    Start date: 2010    Quit date: 2021    Years since quitting: 3.9   Smokeless tobacco: Never  Vaping Use   Vaping status: Every Day  Substance and Sexual Activity   Alcohol use: Yes    Alcohol/week: 1.0 standard drink of alcohol    Types: 1 Glasses of wine per week     Comment: 1-2 glasses a week   Drug use: Not Currently    Types: Marijuana   Sexual activity: Yes    Birth control/protection: None  Other Topics Concern   Not on file  Social History Narrative   ** Merged History Encounter **       Social Drivers of Corporate investment banker Strain: Not on file  Food Insecurity: Not on file  Transportation Needs: Not on file  Physical Activity: Not on file  Stress: Not on file  Social Connections: Not on file  Intimate Partner Violence: Not on file    Physical Exam: Vital signs in last 24 hours: BP 104/72   Pulse 81   Temp 98.2 F (36.8 C) (Temporal)   Ht 5\' 3"  (1.6 m)   Wt 141 lb (64 kg)   SpO2 99%   BMI 24.98 kg/m  GEN: NAD EYE: Sclerae anicteric ENT: MMM CV: Non-tachycardic Pulm: No increased WOB GI: Soft NEURO:  Alert & Oriented   Eulah Pont, MD [facility]   09/22/2023 2:35 PM

## 2023-09-22 NOTE — Progress Notes (Signed)
Called to room to assist during endoscopic procedure.  Patient ID and intended procedure confirmed with present staff. Received instructions for my participation in the procedure from the performing physician.  

## 2023-09-23 ENCOUNTER — Telehealth: Payer: Self-pay | Admitting: *Deleted

## 2023-09-23 NOTE — Telephone Encounter (Signed)
  Follow up Call-     09/22/2023    2:22 PM  Call back number  Post procedure Call Back phone  # [phone]  Permission to leave phone message Yes     Patient questions:  Do you have a fever, pain , or abdominal swelling? No. Pain Score  0 *  Have you tolerated food without any problems? Yes.    Have you been able to return to your normal activities? Yes.    Do you have any questions about your discharge instructions: Diet   No. Medications  No. Follow up visit  No.  Do you have questions or concerns about your Care? No.  Actions: * If pain score is 4 or above: No action needed, pain <4.

## 2023-09-24 DIAGNOSIS — Z419 Encounter for procedure for purposes other than remedying health state, unspecified: Secondary | ICD-10-CM | POA: Diagnosis not present

## 2023-09-29 ENCOUNTER — Encounter: Payer: Self-pay | Admitting: Internal Medicine

## 2023-09-29 LAB — SURGICAL PATHOLOGY

## 2023-10-02 ENCOUNTER — Telehealth: Payer: Self-pay

## 2023-10-02 NOTE — Telephone Encounter (Signed)
-----   Message from Rosario JAYSON Kidney sent at 09/29/2023  1:22 PM EST ----- Spoke to the patient about the results of her colonoscopy, which showed inflammation in both the rectum and in the left colon.  This suggest that her inflammation at least extends to either the sigmoid or descending colon, possibly even the left transverse colon.  I offered her the option to start on treatment for ulcerative colitis, which the patient will think about this time.  I offered her either oral mesalamine such as Lialda 4.8 g daily or Rowasa enema nightly.  Patient is leaning towards the oral Lialda at this time but would like to talk about this further with her husband and mother.  She will message or call back once she has made a decision about what treatment she may want to start.  Would then plan for repeat colonoscopy 6 months after she has started treatment to see if she were responding appropriately to this treatment.  Landry LIPS since she may call or message back.

## 2023-10-25 DIAGNOSIS — Z419 Encounter for procedure for purposes other than remedying health state, unspecified: Secondary | ICD-10-CM | POA: Diagnosis not present

## 2023-11-22 DIAGNOSIS — Z419 Encounter for procedure for purposes other than remedying health state, unspecified: Secondary | ICD-10-CM | POA: Diagnosis not present

## 2024-01-03 DIAGNOSIS — Z419 Encounter for procedure for purposes other than remedying health state, unspecified: Secondary | ICD-10-CM | POA: Diagnosis not present

## 2024-02-02 DIAGNOSIS — Z419 Encounter for procedure for purposes other than remedying health state, unspecified: Secondary | ICD-10-CM | POA: Diagnosis not present

## 2024-03-04 DIAGNOSIS — Z419 Encounter for procedure for purposes other than remedying health state, unspecified: Secondary | ICD-10-CM | POA: Diagnosis not present

## 2024-03-16 ENCOUNTER — Ambulatory Visit (INDEPENDENT_AMBULATORY_CARE_PROVIDER_SITE_OTHER): Admitting: Physician Assistant

## 2024-03-16 ENCOUNTER — Encounter: Payer: Self-pay | Admitting: Physician Assistant

## 2024-03-16 VITALS — BP 118/72 | HR 76 | Temp 98.6°F | Ht 61.42 in | Wt 146.8 lb

## 2024-03-16 DIAGNOSIS — Z131 Encounter for screening for diabetes mellitus: Secondary | ICD-10-CM

## 2024-03-16 DIAGNOSIS — Z0000 Encounter for general adult medical examination without abnormal findings: Secondary | ICD-10-CM | POA: Diagnosis not present

## 2024-03-16 DIAGNOSIS — Z3491 Encounter for supervision of normal pregnancy, unspecified, first trimester: Secondary | ICD-10-CM | POA: Diagnosis not present

## 2024-03-16 DIAGNOSIS — E559 Vitamin D deficiency, unspecified: Secondary | ICD-10-CM

## 2024-03-16 DIAGNOSIS — K5100 Ulcerative (chronic) pancolitis without complications: Secondary | ICD-10-CM

## 2024-03-16 LAB — VITAMIN D 25 HYDROXY (VIT D DEFICIENCY, FRACTURES): VITD: 21.64 ng/mL — ABNORMAL LOW (ref 30.00–100.00)

## 2024-03-16 LAB — HEMOGLOBIN A1C: Hgb A1c MFr Bld: 5.7 % (ref 4.6–6.5)

## 2024-03-16 NOTE — Progress Notes (Signed)
 Patient ID: Rachel Hanson, female    DOB: [birth year], 38 y.o.   MRN: [id number]   Assessment & Plan:  Annual physical exam -     Hemoglobin A1c; Future -     VITAMIN D  25 Hydroxy (Vit-D Deficiency, Fractures); Future  First trimester pregnancy  Vitamin D  deficiency -     VITAMIN D  25 Hydroxy (Vit-D Deficiency, Fractures); Future  Diabetes mellitus screening -     Hemoglobin A1c; Future    Assessment & Plan Pregnancy Approximately [redacted] weeks gestation with symptoms of nausea, breast tenderness, and fatigue. POSITIVE AT HOME TEST. Experiencing anxiety due to age and physical condition. Currently not under OB-GYN care due to insurance issues. Taking prenatal vitamins and aware of the importance of monitoring for symptoms such as bleeding or severe pain. - Continue prenatal vitamins - Resolve insurance issues to schedule OB-GYN appointment - Monitor for symptoms such as bleeding or severe pain - Visit maternal assessment unit at [facility] if urgent care is needed - Maintain a balanced diet and consider light exercise  Ulcerative Colitis Ulcerative colitis in remission. Advised by GI specialist to consider suppositories or enemas for inflammation management. No active symptoms. Recommended to contact GI specialist for management during pregnancy. - Contact GI specialist for ulcerative colitis management during pregnancy - Consider starting suppositories or enemas if advised by GI specialist  Vitamin D  Deficiency Low vitamin D  levels with inconsistent supplement use. Reports feeling better with consistent intake. Vitamin D  levels to be checked for dosage adjustment. - Check vitamin D  levels - Encourage consistent vitamin D  supplement use  General Health Maintenance Aware of upcoming screenings: mammograms starting at age 70 and colon cancer screening at age 56. Recently had a colonoscopy due to ulcerative colitis. - Schedule mammogram at age 34 - Continue regular colonoscopy  screenings as advised by GI specialist  Follow-up Plans to follow up with OB-GYN once insurance issues are resolved. Due for follow-up with family practice in one year. - Schedule OB-GYN follow-up - Return for family practice follow-up in one year      Return in about 1 year (around 03/16/2025) for physical.    Subjective:    Chief Complaint  Patient presents with   Annual Exam    Pt in office for annual CPE; pt recently found out she is pregnant otherwise everything is still the same; pt concerned with possible Vitamin D  deficiency; pt is also trying to be more active and mindful with diet; pt not set up for first appt with OBGYN as of yet.    HPI Discussed the use of AI scribe software for clinical note transcription with the patient, who gave verbal consent to proceed.  History of Present Illness Rachel Hanson is a 38 year old female who presents with early pregnancy symptoms and concerns.  She is approximately eight weeks pregnant and initially experienced sore breasts and nausea, prompting her to take multiple pregnancy tests, all showing faint positive lines. She feels surprised and ambivalent about the pregnancy, noting she was not actively trying to conceive. Her husband is excited, but she experiences more nausea compared to her first pregnancy, feeling constantly nauseous without vomiting. This pregnancy feels more challenging due to being 'more out of shape,' and she weighs as much now as at the end of her previous pregnancy. She is taking prenatal vitamins and is managing new insurance to continue seeing her preferred OB-GYN.  She has a history of low vitamin D  and high cholesterol, with inconsistent  vitamin D  supplementation. She feels tired and has not noticed improvement from last year. She is concerned about a brown skin spot that has grown, which is not itchy or irritated.  She has ulcerative colitis, diagnosed in high school, with no active inflammation or flare-ups  currently. A colonoscopy last year confirmed ulcers in the colon. She has not started any treatment.  She works full-time from home with increased responsibilities and stress, working Sunday through Thursday from 10 AM to 7 PM. She uses a walking pad to incorporate physical activity, walking 30-45 minutes daily. She feels mentally exhausted after work, especially with the added responsibility of caring for her four-year-old son.  She experiences lightheadedness and heart palpitations after consuming fruit, except bananas, which she attributes to sugar intake. She prefers vegetables and eats small portions to manage nausea. She reports back pain, possibly from sitting all day, and significant breast pain, particularly in the right breast when coughing.     Past Medical History:  Diagnosis Date   Allergy    Anxiety    Bartholin cyst    High cholesterol    HSV (herpes simplex virus) anogenital infection    Low vitamin D  level    Ulcerative colitis (HCC) 2001   when she was in [facility]    Past Surgical History:  Procedure Laterality Date   CESAREAN SECTION N/A 03/13/2020   Procedure: CESAREAN SECTION;  Surgeon: Delana Ted Morrison, DO;  Location: [facility] LD ORS;  Service: Obstetrics;  Laterality: N/A;   CESAREAN SECTION     CYSTECTOMY     L wrist    Family History  Adopted: Yes  Family history unknown: Yes    Social History   Tobacco Use   Smoking status: Former    Current packs/day: 0.00    Average packs/day: 0.5 packs/day for 11.0 years (5.5 ttl pk-yrs)    Types: Cigarettes    Start date: 2010    Quit date: 2021    Years since quitting: 4.4   Smokeless tobacco: Never  Vaping Use   Vaping status: Every Day  Substance Use Topics   Alcohol use: Yes    Alcohol/week: 1.0 standard drink of alcohol    Types: 1 Glasses of wine per week    Comment: 1-2 glasses a week   Drug use: Not Currently    Types: Marijuana     Allergies  Allergen Reactions   Sulfamethoxazole   Hives, Swelling and Other (See Comments)    Face redness    Sulfamethoxazole -Trimethoprim  Swelling    2 episodes of facial swelling, rash following initiation of Bactrim   2 episodes of facial swelling, rash following initiation of Bactrim . Flu like symptoms   Misc. Sulfonamide Containing Compounds Rash    Review of Systems NEGATIVE UNLESS OTHERWISE INDICATED IN HPI      Objective:     BP 118/72 (BP Location: Left Arm, Patient Position: Sitting, Cuff Size: Normal)   Pulse 76   Temp 98.6 F (37 C) (Temporal)   Ht 5' 1.42 (1.56 m)   Wt 146 lb 12.8 oz (66.6 kg)   LMP 01/20/2024 (Exact Date)   SpO2 96%   BMI 27.36 kg/m   Wt Readings from Last 3 Encounters:  03/16/24 146 lb 12.8 oz (66.6 kg)  09/22/23 141 lb (64 kg)  08/11/23 141 lb (64 kg)    BP Readings from Last 3 Encounters:  03/16/24 118/72  09/22/23 107/64  08/11/23 112/68     Physical Exam Vitals and nursing note reviewed.  Constitutional:      Appearance: Normal appearance. She is normal weight. She is not toxic-appearing.  HENT:     Head: Normocephalic and atraumatic.     Right Ear: Tympanic membrane, ear canal and external ear normal.     Left Ear: Tympanic membrane, ear canal and external ear normal.     Nose: Nose normal.     Mouth/Throat:     Mouth: Mucous membranes are moist.   Eyes:     Extraocular Movements: Extraocular movements intact.     Conjunctiva/sclera: Conjunctivae normal.     Pupils: Pupils are equal, round, and reactive to light.    Cardiovascular:     Rate and Rhythm: Normal rate and regular rhythm.     Pulses: Normal pulses.     Heart sounds: Normal heart sounds.  Pulmonary:     Effort: Pulmonary effort is normal.     Breath sounds: Normal breath sounds.  Abdominal:     General: Abdomen is flat. Bowel sounds are normal.     Palpations: Abdomen is soft.   Musculoskeletal:        General: Normal range of motion.     Cervical back: Normal range of motion and neck supple.    Skin:    General: Skin is warm and dry.   Neurological:     General: No focal deficit present.     Mental Status: She is alert and oriented to person, place, and time.   Psychiatric:        Mood and Affect: Mood normal.        Behavior: Behavior normal.        Thought Content: Thought content normal.        Judgment: Judgment normal.             Syann Cupples M Raeann Offner, PA-C

## 2024-03-23 ENCOUNTER — Ambulatory Visit: Payer: Self-pay | Admitting: Physician Assistant

## 2024-04-03 DIAGNOSIS — Z419 Encounter for procedure for purposes other than remedying health state, unspecified: Secondary | ICD-10-CM | POA: Diagnosis not present

## 2024-04-20 LAB — OB RESULTS CONSOLE RPR: RPR: NONREACTIVE

## 2024-04-20 LAB — OB RESULTS CONSOLE HEPATITIS B SURFACE ANTIGEN
Hepatitis B Surface Ag: NEGATIVE
Hepatitis B Surface Ag: NEGATIVE

## 2024-04-20 LAB — HEPATITIS C ANTIBODY: HCV Ab: NEGATIVE

## 2024-04-20 LAB — OB RESULTS CONSOLE GC/CHLAMYDIA
Chlamydia: NEGATIVE
Neisseria Gonorrhea: NEGATIVE

## 2024-04-20 LAB — OB RESULTS CONSOLE HIV ANTIBODY (ROUTINE TESTING): HIV: NONREACTIVE

## 2024-04-20 LAB — OB RESULTS CONSOLE RUBELLA ANTIBODY, IGM: Rubella: IMMUNE

## 2024-05-04 DIAGNOSIS — Z419 Encounter for procedure for purposes other than remedying health state, unspecified: Secondary | ICD-10-CM | POA: Diagnosis not present

## 2024-06-04 DIAGNOSIS — Z419 Encounter for procedure for purposes other than remedying health state, unspecified: Secondary | ICD-10-CM | POA: Diagnosis not present

## 2024-09-03 DIAGNOSIS — Z419 Encounter for procedure for purposes other than remedying health state, unspecified: Secondary | ICD-10-CM | POA: Diagnosis not present

## 2024-09-28 LAB — OB RESULTS CONSOLE GBS: GBS: NEGATIVE

## 2024-10-04 ENCOUNTER — Encounter (HOSPITAL_COMMUNITY): Payer: Self-pay | Admitting: *Deleted

## 2024-10-04 NOTE — Patient Instructions (Signed)
"   Rachel Hanson  10/04/2024   Your procedure is scheduled on:  10/18/2024  Arrive at 0530 at Entrance C on [address] at [facility]  and [address]. You are invited to use the FREE valet parking or use the Visitor's parking deck.  Pick up the phone at the desk and dial [phone].  Call this number if you have problems the morning of surgery: [phone]  Remember:   Do not eat food:(After Midnight) Desps de medianoche.  You may drink clear liquids until  __0330___.  Clear liquids means a liquid you can see thru.  It can have color such as Cola or Kool aid.  Tea is OK and coffee as long as no milk or creamer of any kind.  Take these medicines the morning of surgery with A SIP OF WATER:  none   Do not wear jewelry, make-up or nail polish.  Do not wear lotions, powders, or perfumes. Do not wear deodorant.  Do not shave 48 hours prior to surgery.  Do not bring valuables to the hospital.  [facility] is not   responsible for any belongings or valuables brought to the hospital.  Contacts, dentures or bridgework may not be worn into surgery.  Leave suitcase in the car. After surgery it may be brought to your room.  For patients admitted to the hospital, checkout time is 11:00 AM the day of              discharge.      Please read over the following fact sheets that you were given:     Preparing for Surgery   "

## 2024-10-06 ENCOUNTER — Encounter (HOSPITAL_COMMUNITY): Payer: Self-pay

## 2024-10-15 ENCOUNTER — Encounter (HOSPITAL_COMMUNITY)
Admission: RE | Admit: 2024-10-15 | Discharge: 2024-10-15 | Disposition: A | Payer: PRIVATE HEALTH INSURANCE | Source: Ambulatory Visit | Attending: Obstetrics & Gynecology

## 2024-10-15 ENCOUNTER — Encounter (HOSPITAL_COMMUNITY): Payer: Self-pay | Admitting: Obstetrics and Gynecology

## 2024-10-15 ENCOUNTER — Encounter (HOSPITAL_COMMUNITY): Admission: AD | Disposition: A | Payer: Self-pay | Source: Home / Self Care | Attending: Obstetrics & Gynecology

## 2024-10-15 ENCOUNTER — Inpatient Hospital Stay (HOSPITAL_COMMUNITY): Admission: RE | Admit: 2024-10-15 | Source: Home / Self Care | Admitting: Obstetrics & Gynecology

## 2024-10-15 ENCOUNTER — Inpatient Hospital Stay (HOSPITAL_COMMUNITY)
Admission: AD | Admit: 2024-10-15 | Discharge: 2024-10-18 | DRG: 784 | Disposition: A | Attending: Obstetrics & Gynecology | Admitting: Obstetrics & Gynecology

## 2024-10-15 ENCOUNTER — Inpatient Hospital Stay (HOSPITAL_COMMUNITY): Payer: PRIVATE HEALTH INSURANCE | Admitting: Anesthesiology

## 2024-10-15 ENCOUNTER — Inpatient Hospital Stay (HOSPITAL_COMMUNITY): Admitting: Anesthesiology

## 2024-10-15 DIAGNOSIS — Z98891 History of uterine scar from previous surgery: Principal | ICD-10-CM

## 2024-10-15 DIAGNOSIS — Z3A39 39 weeks gestation of pregnancy: Secondary | ICD-10-CM | POA: Diagnosis not present

## 2024-10-15 DIAGNOSIS — O34219 Maternal care for unspecified type scar from previous cesarean delivery: Secondary | ICD-10-CM | POA: Insufficient documentation

## 2024-10-15 DIAGNOSIS — O9832 Other infections with a predominantly sexual mode of transmission complicating childbirth: Secondary | ICD-10-CM | POA: Diagnosis present

## 2024-10-15 DIAGNOSIS — Z302 Encounter for sterilization: Secondary | ICD-10-CM | POA: Diagnosis not present

## 2024-10-15 DIAGNOSIS — O26893 Other specified pregnancy related conditions, third trimester: Secondary | ICD-10-CM | POA: Diagnosis present

## 2024-10-15 DIAGNOSIS — O34211 Maternal care for low transverse scar from previous cesarean delivery: Principal | ICD-10-CM | POA: Diagnosis present

## 2024-10-15 DIAGNOSIS — Z87891 Personal history of nicotine dependence: Secondary | ICD-10-CM

## 2024-10-15 DIAGNOSIS — A6000 Herpesviral infection of urogenital system, unspecified: Secondary | ICD-10-CM | POA: Diagnosis present

## 2024-10-15 LAB — CBC
HCT: 38.8 % (ref 36.0–46.0)
Hemoglobin: 12.6 g/dL (ref 12.0–15.0)
MCH: 32 pg (ref 26.0–34.0)
MCHC: 32.5 g/dL (ref 30.0–36.0)
MCV: 98.5 fL (ref 80.0–100.0)
Platelets: 223 K/uL (ref 150–400)
RBC: 3.94 MIL/uL (ref 3.87–5.11)
RDW: 14.9 % (ref 11.5–15.5)
WBC: 9.1 K/uL (ref 4.0–10.5)
nRBC: 0 % (ref 0.0–0.2)

## 2024-10-15 LAB — POCT FERN TEST: POCT Fern Test: POSITIVE

## 2024-10-15 LAB — TYPE AND SCREEN
ABO/RH(D): B POS
Antibody Screen: NEGATIVE

## 2024-10-15 LAB — SYPHILIS: RPR W/REFLEX TO RPR TITER AND TREPONEMAL ANTIBODIES, TRADITIONAL SCREENING AND DIAGNOSIS ALGORITHM: RPR Ser Ql: NONREACTIVE

## 2024-10-15 MED ORDER — ACETAMINOPHEN 10 MG/ML IV SOLN
INTRAVENOUS | Status: DC | PRN
  Administered 2024-10-15: 1000 mg via INTRAVENOUS

## 2024-10-15 MED ORDER — TRANEXAMIC ACID-NACL 1000-0.7 MG/100ML-% IV SOLN
INTRAVENOUS | Status: AC
  Filled 2024-10-15: qty 100

## 2024-10-15 MED ORDER — KETOROLAC TROMETHAMINE 30 MG/ML IJ SOLN
INTRAMUSCULAR | Status: AC
  Filled 2024-10-15: qty 1

## 2024-10-15 MED ORDER — BUPIVACAINE IN DEXTROSE 0.75-8.25 % IT SOLN
INTRATHECAL | Status: DC | PRN
  Administered 2024-10-15: 1.6 mL via INTRATHECAL

## 2024-10-15 MED ORDER — DEXAMETHASONE SOD PHOSPHATE PF 10 MG/ML IJ SOLN
INTRAMUSCULAR | Status: DC | PRN
  Administered 2024-10-15: 10 mg via INTRAVENOUS

## 2024-10-15 MED ORDER — TRANEXAMIC ACID-NACL 1000-0.7 MG/100ML-% IV SOLN
INTRAVENOUS | Status: DC | PRN
  Administered 2024-10-15: 1000 mg via INTRAVENOUS

## 2024-10-15 MED ORDER — STERILE WATER FOR IRRIGATION IR SOLN
Status: DC | PRN
  Administered 2024-10-15: 1000 mL

## 2024-10-15 MED ORDER — NALOXONE HCL 0.4 MG/ML IJ SOLN: 0.4000 mg | INTRAMUSCULAR | Status: DC | PRN

## 2024-10-15 MED ORDER — SOD CITRATE-CITRIC ACID 500-334 MG/5ML PO SOLN
30.0000 mL | Freq: Once | ORAL | Status: AC
  Administered 2024-10-15: 30 mL via ORAL
  Filled 2024-10-15: qty 30

## 2024-10-15 MED ORDER — ACETAMINOPHEN 10 MG/ML IV SOLN
INTRAVENOUS | Status: AC
  Filled 2024-10-15: qty 100

## 2024-10-15 MED ORDER — METOCLOPRAMIDE HCL 5 MG/ML IJ SOLN
INTRAMUSCULAR | Status: DC | PRN
  Administered 2024-10-15: 10 mg via INTRAVENOUS

## 2024-10-15 MED ORDER — SODIUM CHLORIDE 0.9% FLUSH: 3.0000 mL | INTRAVENOUS | Status: DC | PRN

## 2024-10-15 MED ORDER — OXYTOCIN-SODIUM CHLORIDE 30-0.9 UT/500ML-% IV SOLN
INTRAVENOUS | Status: AC
  Filled 2024-10-15: qty 500

## 2024-10-15 MED ORDER — KETOROLAC TROMETHAMINE 30 MG/ML IJ SOLN
30.0000 mg | Freq: Four times a day (QID) | INTRAMUSCULAR | Status: DC | PRN
  Administered 2024-10-15: 30 mg via INTRAVENOUS

## 2024-10-15 MED ORDER — KETOROLAC TROMETHAMINE 30 MG/ML IJ SOLN: 30.0000 mg | Freq: Four times a day (QID) | INTRAMUSCULAR | Status: DC | PRN

## 2024-10-15 MED ORDER — TRANEXAMIC ACID-NACL 1000-0.7 MG/100ML-% IV SOLN: 1000.0000 mg | INTRAVENOUS | Status: AC

## 2024-10-15 MED ORDER — OXYCODONE HCL 5 MG/5ML PO SOLN: 5.0000 mg | Freq: Once | ORAL | Status: DC | PRN

## 2024-10-15 MED ORDER — ONDANSETRON HCL 4 MG/2ML IJ SOLN: 4.0000 mg | Freq: Three times a day (TID) | INTRAMUSCULAR | Status: DC | PRN

## 2024-10-15 MED ORDER — DEXAMETHASONE SOD PHOSPHATE PF 10 MG/ML IJ SOLN
INTRAMUSCULAR | Status: AC
  Filled 2024-10-15: qty 1

## 2024-10-15 MED ORDER — CEFAZOLIN SODIUM-DEXTROSE 2-4 GM/100ML-% IV SOLN
2.0000 g | INTRAVENOUS | Status: AC
  Administered 2024-10-15: 2 g via INTRAVENOUS
  Filled 2024-10-15: qty 100

## 2024-10-15 MED ORDER — LACTATED RINGERS IV SOLN: INTRAVENOUS | Status: DC

## 2024-10-15 MED ORDER — ACETAMINOPHEN 500 MG PO TABS
1000.0000 mg | ORAL_TABLET | Freq: Four times a day (QID) | ORAL | Status: AC
  Administered 2024-10-16 (×3): 1000 mg via ORAL
  Filled 2024-10-15 (×5): qty 2

## 2024-10-15 MED ORDER — SODIUM CHLORIDE 0.9 % IV SOLN
INTRAVENOUS | Status: DC | PRN
  Administered 2024-10-15: 500 mg via INTRAVENOUS

## 2024-10-15 MED ORDER — SODIUM CHLORIDE 0.9 % IR SOLN
Status: DC | PRN
  Administered 2024-10-15: 1

## 2024-10-15 MED ORDER — DROPERIDOL 2.5 MG/ML IJ SOLN: 0.6250 mg | Freq: Once | INTRAMUSCULAR | Status: DC | PRN

## 2024-10-15 MED ORDER — FENTANYL CITRATE (PF) 100 MCG/2ML IJ SOLN
INTRAMUSCULAR | Status: DC | PRN
  Administered 2024-10-15: 15 ug via INTRAVENOUS

## 2024-10-15 MED ORDER — MORPHINE SULFATE (PF) 0.5 MG/ML IJ SOLN
INTRAMUSCULAR | Status: AC
  Filled 2024-10-15: qty 10

## 2024-10-15 MED ORDER — SODIUM CHLORIDE 0.9 % IV SOLN: INTRAVENOUS | Status: DC | PRN

## 2024-10-15 MED ORDER — ONDANSETRON HCL 4 MG/2ML IJ SOLN
INTRAMUSCULAR | Status: DC | PRN
  Administered 2024-10-15: 4 mg via INTRAVENOUS

## 2024-10-15 MED ORDER — PHENYLEPHRINE HCL-NACL 20-0.9 MG/250ML-% IV SOLN
INTRAVENOUS | Status: DC | PRN
  Administered 2024-10-15: 30 ug/min via INTRAVENOUS

## 2024-10-15 MED ORDER — OXYCODONE HCL 5 MG PO TABS: 5.0000 mg | ORAL_TABLET | Freq: Once | ORAL | Status: DC | PRN

## 2024-10-15 MED ORDER — SODIUM CHLORIDE 0.9 % IV SOLN
INTRAVENOUS | Status: AC
  Filled 2024-10-15: qty 5

## 2024-10-15 MED ORDER — OXYTOCIN-SODIUM CHLORIDE 30-0.9 UT/500ML-% IV SOLN
INTRAVENOUS | Status: DC | PRN
  Administered 2024-10-15: 30 [IU] via INTRAVENOUS

## 2024-10-15 MED ORDER — FENTANYL CITRATE (PF) 100 MCG/2ML IJ SOLN: 25.0000 ug | INTRAMUSCULAR | Status: DC | PRN

## 2024-10-15 MED ORDER — DIPHENHYDRAMINE HCL 25 MG PO CAPS: 25.0000 mg | ORAL_CAPSULE | ORAL | Status: DC | PRN

## 2024-10-15 MED ORDER — FENTANYL CITRATE (PF) 100 MCG/2ML IJ SOLN
INTRAMUSCULAR | Status: AC
  Filled 2024-10-15: qty 2

## 2024-10-15 MED ORDER — NALOXONE HCL 4 MG/10ML IJ SOLN: 1.0000 ug/kg/h | INTRAVENOUS | Status: DC | PRN

## 2024-10-15 MED ORDER — DIPHENHYDRAMINE HCL 50 MG/ML IJ SOLN: 12.5000 mg | INTRAMUSCULAR | Status: DC | PRN

## 2024-10-15 MED ORDER — POVIDONE-IODINE 10 % EX SWAB
2.0000 | Freq: Once | CUTANEOUS | Status: AC
  Administered 2024-10-15: 2 via TOPICAL

## 2024-10-15 MED ORDER — ONDANSETRON HCL 4 MG/2ML IJ SOLN
INTRAMUSCULAR | Status: AC
  Filled 2024-10-15: qty 2

## 2024-10-15 MED ORDER — MORPHINE SULFATE (PF) 0.5 MG/ML IJ SOLN
INTRAMUSCULAR | Status: DC | PRN
  Administered 2024-10-15: 150 ug via EPIDURAL

## 2024-10-15 NOTE — H&P (Addendum)
 Rachel Hanson is a 39 y.o. female G2P1001 presenting for SROM. Hx CS for suspected HSV outbreak.  Antepartum course complicated by ulcerative colitis; no medication.  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  Low risk NIPT.  Negative GBS.  Patient has stayed on asa and did not stop at 37 wga. Last dose last night.  OB History     Gravida  2   Para  1   Term  1   Preterm  0   AB  0   Living  1      SAB  0   IAB  0   Ectopic  0   Multiple      Live Births  1          Past Medical History:  Diagnosis Date   Allergy    Anxiety    Bartholin cyst    High cholesterol    HSV (herpes simplex virus) anogenital infection    Low vitamin D  level    Ulcerative colitis (HCC) 2001   when she was in [place]   Past Surgical History:  Procedure Laterality Date   CESAREAN SECTION N/A 03/13/2020   Procedure: CESAREAN SECTION;  Surgeon: Delana Ted Morrison, DO;  Location: [facility] LD ORS;  Service: Obstetrics;  Laterality: N/A;   CESAREAN SECTION     CYSTECTOMY     L wrist   Family History: She was adopted. Family history is unknown by patient. Social History:  reports that she quit smoking about 5 years ago. Her smoking use included cigarettes. She started smoking about 16 years ago. She has a 5.5 pack-year smoking history. She has never used smokeless tobacco. She reports current alcohol use of about 1.0 standard drink of alcohol per week. She reports that she does not currently use drugs after having used the following drugs: Marijuana.     Maternal Diabetes: No Genetic Screening: Normal Maternal Ultrasounds/Referrals: Normal Fetal Ultrasounds or other Referrals:  None Maternal Substance Abuse:  No Significant Maternal Medications:  Meds include: Other: valtrex Significant Maternal Lab Results:  Group B Strep negative Number of Prenatal Visits:greater than 3 verified prenatal visits Maternal Vaccinations:TDap Other Comments:  None  Review of Systems Maternal Medical History:   Prenatal complications: no prenatal complications Prenatal Complications - Diabetes: none.     Blood pressure 117/73, pulse 88, temperature 98.2 F (36.8 C), temperature source Oral, resp. rate 15, height 5' 2.5 (1.588 m), weight 89.1 kg, last menstrual period 01/20/2024, SpO2 95%. Maternal Exam:  Abdomen: Patient reports no abdominal tenderness. Surgical scars: low transverse.   Fundal height is c/w dates.   Estimated fetal weight is 7#8.     Physical Exam Constitutional:      Appearance: Normal appearance.  HENT:     Head: Normocephalic and atraumatic.  Pulmonary:     Effort: Pulmonary effort is normal.  Abdominal:     Palpations: Abdomen is soft.  Musculoskeletal:        General: Normal range of motion.     Cervical back: Normal range of motion.  Skin:    General: Skin is warm and dry.  Neurological:     Mental Status: She is alert and oriented to person, place, and time.  Psychiatric:        Mood and Affect: Mood normal.        Behavior: Behavior normal.     Prenatal labs: ABO, Rh: --/--/B POS (01/23 0939)B pos Antibody: NEG (01/23 0939)Negative Rubella: Immune (07/29 0000) RPR: NON REACTIVE (  01/23 0932)  HBsAg: Negative, Negative (07/29 0000)  HIV: Non-reactive (07/29 0000)  GBS: Negative/-- (01/06 0000)   Assessment/Plan: 39 yo G2P1001 at 39 weeks for repeat C/S and BTL.  Risks discussed including infection, bleeding, damage to surrounding structures, the need for additional procedures including hysterectomy, and the possibility of uterine rupture with neonatal morbidity/mortality, scarring, and abnormal placentation with subsequent pregnancies. We also discussed bilateral tubal ligation in detail. The alternatives to permanent sterilization were reviewed and it was discussed that this is considered permanent. We reviewed the risks in detail including regret, failure and ectopic pregnancy. She understands and agrees to proceed.    Kelly Delon Milian 10/15/2024, 8:11 PM

## 2024-10-15 NOTE — Op Note (Addendum)
 PROCEDURE DATE:  10/15/24   PREOPERATIVE DIAGNOSIS: Intrauterine pregnancy at  54 wga, Indication: repeat and desires permanent sterility.    POSTOPERATIVE DIAGNOSIS: The same   PROCEDURE:    Repeat Low Transverse Cesarean Section with BTL   SURGEON:  Dr. Kelly Milian    INDICATIONS: This is a 39 yo G2P1 at 66 wga requiring cesarean section secondary to repeat and desires permanent sterility. Decision made to proceed with LTCS. The risks of cesarean section discussed with the patient included but were not limited to: bleeding which may require transfusion or reoperation; infection which may require antibiotics; injury to bowel, bladder, ureters or other surrounding organs; injury to the fetus; need for additional procedures including hysterectomy in the event of a life-threatening hemorrhage; placental abnormalities wth subsequent pregnancies, incisional problems, thromboembolic phenomenon and other postoperative/anesthesia complications. We also discussed bilateral tubal ligation in detail. The alternatives to permanent sterilization were reviewed and it was discussed that this is considered permanent. We reviewed the risks in detail including regret, failure and ectopic pregnancy.   The patient agreed with the proposed plan, giving informed consent for the procedure.     FINDINGS:  Viable female infant in vertex presentation, APGARs pending,  Weight pending, Amniotic fluid meconium,  Intact placenta, three vessel cord.  Grossly normal  ovaries and fallopian tubes.> Some dense scarring on uterus and bladder flap was very high.  .   ANESTHESIA:    Epidural ESTIMATED BLOOD LOSS: 700cc SPECIMENS: Placenta for routine COMPLICATIONS: None immediate   PROCEDURE IN DETAIL:     The patient received intravenous antibiotics (2g Ancef ) and had sequential compression devices applied to her lower extremities while in the preoperative area.  She was then taken to the operating room where epidural anesthesia  was dosed up to surgical level and was found to be adequate. She was then placed in a dorsal supine position with a leftward tilt, and prepped and draped in a sterile manner.  A foley catheter was placed into her bladder and attached to constant gravity.  After an adequate timeout was performed, a Pfannenstiel skin incision was made with scalpel and carried through to the underlying layer of fascia. The fascia was incised in the midline and this incision was extended bilaterally using the Mayo scissors. Kocher clamps were applied to the superior aspect of the fascial incision and the underlying rectus muscles were dissected off bluntly. A similar process was carried out on the inferior aspect of the facial incision. The rectus muscles were separated in the midline bluntly and the peritoneum was entered bluntly.  A bladder flap was created sharply and developed bluntly. A transverse hysterotomy was made with a scalpel and extended bilaterally bluntly. The bladder blade was then removed. The infant was successfully delivered, and cord was clamped and cut and infant was handed over to awaiting neonatology team. Uterine massage was then administered and the placenta delivered intact with three-vessel cord. Cord gases were taken. The uterus was cleared of clot and debris.  The hysterotomy was closed with 0 vicryl.  A second imbricating suture of 0-vicryl was used to reinforce the incision and aid in hemostasis. Two additional stitches in LUS for hemostasis. Arista placed.  A bilateral tubal ligation was performed via ligasure in the usual fashion.  Good hemostasis was noted before and after uterus and fallopian tubes placed back into abdomen. The fascia was closed with 0-Vicryl in a running fashion with good restoration of anatomy.  The subcutaneus tissue was irrigated and was reapproximated  using three interrupted plain gut stitches.  The skin was closed with 4-0 Vicryl in a subcuticular fashion.   All surgical site  and was hemostatic at end of procedure without any further bleeding on exam.    Pt tolerated the procedure well. All sponge/lap/needle counts were correct  X 2. Pt taken to recovery room in stable condition.     Kelly Milian MD

## 2024-10-15 NOTE — Transfer of Care (Signed)
 Immediate Anesthesia Transfer of Care Note  Patient: Rachel Hanson  Procedure(s) Performed: CESAREAN SECTION, WITH BILATERAL TUBAL LIGATION  Patient Location: PACU  Anesthesia Type:Spinal  Level of Consciousness: awake, alert , and oriented  Airway & Oxygen Therapy: Patient Spontanous Breathing  Post-op Assessment: Report given to RN and Post -op Vital signs reviewed and stable  Post vital signs: Reviewed and stable  Last Vitals:  Vitals Value Taken Time  BP 124/71 10/15/24 22:26  Temp    Pulse 91 10/15/24 22:26  Resp 17 10/15/24 22:26  SpO2 99 % 10/15/24 22:26  Vitals shown include unfiled device data.  Last Pain:  Vitals:   10/15/24 1915  TempSrc: Oral         Complications: No notable events documented.

## 2024-10-15 NOTE — Anesthesia Procedure Notes (Signed)
 Spinal  Patient location during procedure: OR Start time: 10/15/2024 8:37 PM End time: 10/15/2024 8:39 PM Reason for block: surgical anesthesia  Staffing Performed: anesthesiologist  Authorized by: Boone Fess, MD   Performed by: Boone Fess, MD  Preanesthetic Checklist Completed: patient identified, IV checked, site marked, risks and benefits discussed, surgical consent, monitors and equipment checked, pre-op evaluation and timeout performed Spinal Block Patient position: sitting Prep: ChloraPrep and site prepped and draped Patient monitoring: heart rate, continuous pulse ox, blood pressure and cardiac monitor Approach: midline Location: L4-5 Injection technique: single-shot Needle Needle type: Whitacre and Introducer  Needle gauge: 24 G Needle length: 9 cm Assessment Sensory level: T6 Events: CSF return  Additional Notes Meticulous sterile technique used throughout (CHG prep, sterile gloves, sterile drape). Negative paresthesia. Negative blood return. Positive free-flowing CSF. Expiration date of kit checked and confirmed. Patient tolerated procedure well, without complications.

## 2024-10-15 NOTE — Anesthesia Preprocedure Evaluation (Signed)
"                                    Anesthesia Evaluation  Patient identified by MRN, date of birth, ID band Patient awake  General Assessment Comment:  Repeat c/s and tubal ligation.  Reviewed: Allergy & Precautions, NPO status , Patient's Chart, lab work & pertinent test results  History of Anesthesia Complications Negative for: history of anesthetic complications  Airway Mallampati: II  TM Distance: >3 FB Neck ROM: Full    Dental no notable dental hx. (+) Teeth Intact   Pulmonary neg pulmonary ROS, neg sleep apnea, neg COPD, Patient abstained from smoking.Not current smoker, former smoker   Pulmonary exam normal breath sounds clear to auscultation       Cardiovascular Exercise Tolerance: Good METS(-) hypertension(-) CAD and (-) Past MI negative cardio ROS (-) dysrhythmias  Rhythm:Regular Rate:Normal - Systolic murmurs    Neuro/Psych  PSYCHIATRIC DISORDERS Anxiety     negative neurological ROS     GI/Hepatic PUD,neg GERD  ,,(+)     (-) substance abuse    Endo/Other  neg diabetes    Renal/GU negative Renal ROS     Musculoskeletal   Abdominal  (+) + obese  Peds  Hematology Denies blood thinner use or bleeding disorders.    Anesthesia Other Findings Denies blood thinner use or bleeding diatheses. Recent labs reviewed. Past Medical History: No date: Allergy No date: Anxiety No date: Bartholin cyst No date: High cholesterol No date: HSV (herpes simplex virus) anogenital infection No date: Low vitamin D  level 2001: Ulcerative colitis (HCC)     Comment:  when she was in [place]   Reproductive/Obstetrics (+) Pregnancy                              Anesthesia Physical Anesthesia Plan  ASA: 2  Anesthesia Plan: Spinal   Post-op Pain Management: Ofirmev  IV (intra-op)* and Toradol  IV (intra-op)*   Induction:   PONV Risk Score and Plan: 4 or greater and Ondansetron  and Dexamethasone   Airway Management Planned:  Natural Airway  Additional Equipment:   Intra-op Plan:   Post-operative Plan:   Informed Consent: I have reviewed the patients History and Physical, chart, labs and discussed the procedure including the risks, benefits and alternatives for the proposed anesthesia with the patient or authorized representative who has indicated his/her understanding and acceptance.       Plan Discussed with: CRNA and Surgeon  Anesthesia Plan Comments: (Spoke to [facility] who deemed this case too urgent to wait for proper NPO time. Otherwise appropriate for spinal.   Discussed R/B/A of neuraxial anesthesia technique with patient: - rare risks of spinal/epidural hematoma, nerve damage, infection - Risk of PDPH - Risk of itching - Risk of nausea and vomiting - Risk of conversion to general anesthesia and its associated risks, including sore throat, damage to lips/teeth/oropharynx, and rare risks such as cardiac and respiratory events. - Risk of surgical bleeding requiring blood products - Risk of allergic reactions Discussed the role of CRNA in patient's perioperative care.  Patient voiced understanding.)        Anesthesia Quick Evaluation  "

## 2024-10-15 NOTE — H&P (Signed)
 Rachel Hanson is a 39 y.o. female G2P1001 presenting for repeat C/S.  Antepartum course complicated by ulcerative colitis; no medication.  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  Low risk NIPT.  Negative GBS.   OB History     Gravida  2   Para  1   Term  1   Preterm  0   AB  0   Living  1      SAB  0   IAB  0   Ectopic  0   Multiple      Live Births  1          Past Medical History:  Diagnosis Date   Allergy    Anxiety    Bartholin cyst    High cholesterol    HSV (herpes simplex virus) anogenital infection    Low vitamin D  level    Ulcerative colitis (HCC) 2001   when she was in [place]   Past Surgical History:  Procedure Laterality Date   CESAREAN SECTION N/A 03/13/2020   Procedure: CESAREAN SECTION;  Surgeon: Delana Ted Morrison, DO;  Location: [facility] ORS;  Service: Obstetrics;  Laterality: N/A;   CESAREAN SECTION     CYSTECTOMY     L wrist   Family History: She was adopted. Family history is unknown by patient. Social History:  reports that she quit smoking about 5 years ago. Her smoking use included cigarettes. She started smoking about 16 years ago. She has a 5.5 pack-year smoking history. She has never used smokeless tobacco. She reports current alcohol use of about 1.0 standard drink of alcohol per week. She reports that she does not currently use drugs after having used the following drugs: Marijuana.     Maternal Diabetes: No Genetic Screening: Normal Maternal Ultrasounds/Referrals: Normal Fetal Ultrasounds or other Referrals:  None Maternal Substance Abuse:  No Significant Maternal Medications:  Meds include: Other: valtrex Significant Maternal Lab Results:  Group B Strep negative Number of Prenatal Visits:greater than 3 verified prenatal visits Maternal Vaccinations:TDap Other Comments:  None  Review of Systems Maternal Medical History:  Prenatal complications: no prenatal complications Prenatal Complications - Diabetes: none.     Last  menstrual period 01/20/2024. Maternal Exam:  Abdomen: Patient reports no abdominal tenderness. Surgical scars: low transverse.   Fundal height is c/w dates.   Estimated fetal weight is 7#8.     Physical Exam Constitutional:      Appearance: Normal appearance.  HENT:     Head: Normocephalic and atraumatic.  Pulmonary:     Effort: Pulmonary effort is normal.  Abdominal:     Palpations: Abdomen is soft.  Musculoskeletal:        General: Normal range of motion.     Cervical back: Normal range of motion.  Skin:    General: Skin is warm and dry.  Neurological:     Mental Status: She is alert and oriented to person, place, and time.  Psychiatric:        Mood and Affect: Mood normal.        Behavior: Behavior normal.     Prenatal labs: ABO, Rh:  B pos Antibody:  Negative Rubella: Immune (07/29 0000) RPR: Nonreactive (07/29 0000)  HBsAg: Negative, Negative (07/29 0000)  HIV: Non-reactive (07/29 0000)  GBS: Negative/-- (01/06 0000)   Assessment/Plan: 39 yo G2P1001 at 39 weeks for repeat C/S and BTL.  Patient is counseled re: risk of bleeding, infection, scarring and damage to surrounding structures.  She is counseled  re: risk of regret, failure and ectopic with BTL.  All questions were answered and patient wishes to proceed.    Duwaine Blumenthal 10/15/2024, 8:12 AM

## 2024-10-15 NOTE — MAU Note (Signed)
 Pt says she was asleep at 530pm- woke and had a gush of fluid - urine color then brownish red - then brownish .  Still feels fluid coming out. Feels some UCS This is repeat C/S- sch  for 10-18-2024.   1St- C/S - was questionable HSV  Today - appointment - Dr Curlene - VE - closed . Has moved less since SROM- In Triage -125 FHR

## 2024-10-16 ENCOUNTER — Encounter (HOSPITAL_COMMUNITY): Payer: Self-pay | Admitting: Obstetrics & Gynecology

## 2024-10-16 MED ORDER — IBUPROFEN 600 MG PO TABS
600.0000 mg | ORAL_TABLET | Freq: Four times a day (QID) | ORAL | Status: DC
  Administered 2024-10-16 – 2024-10-18 (×7): 600 mg via ORAL
  Filled 2024-10-16 (×7): qty 1

## 2024-10-16 MED ORDER — WITCH HAZEL-GLYCERIN EX PADS: 1.0000 | MEDICATED_PAD | CUTANEOUS | Status: DC | PRN

## 2024-10-16 MED ORDER — OXYTOCIN-SODIUM CHLORIDE 30-0.9 UT/500ML-% IV SOLN: 2.5000 [IU]/h | INTRAVENOUS | Status: AC

## 2024-10-16 MED ORDER — SENNOSIDES-DOCUSATE SODIUM 8.6-50 MG PO TABS
2.0000 | ORAL_TABLET | Freq: Every day | ORAL | Status: DC
  Administered 2024-10-18: 2 via ORAL
  Filled 2024-10-16: qty 2

## 2024-10-16 MED ORDER — COCONUT OIL OIL: 1.0000 | TOPICAL_OIL | Status: DC | PRN

## 2024-10-16 MED ORDER — OXYCODONE HCL 5 MG PO TABS
5.0000 mg | ORAL_TABLET | ORAL | Status: DC | PRN
  Administered 2024-10-17 – 2024-10-18 (×5): 5 mg via ORAL
  Filled 2024-10-16 (×5): qty 1

## 2024-10-16 MED ORDER — SIMETHICONE 80 MG PO CHEW: 80.0000 mg | CHEWABLE_TABLET | ORAL | Status: DC | PRN

## 2024-10-16 MED ORDER — ZOLPIDEM TARTRATE 5 MG PO TABS: 5.0000 mg | ORAL_TABLET | Freq: Every evening | ORAL | Status: DC | PRN

## 2024-10-16 MED ORDER — ACETAMINOPHEN 500 MG PO TABS
1000.0000 mg | ORAL_TABLET | Freq: Four times a day (QID) | ORAL | Status: DC
  Administered 2024-10-17 – 2024-10-18 (×7): 1000 mg via ORAL
  Filled 2024-10-16 (×7): qty 2

## 2024-10-16 MED ORDER — PRENATAL MULTIVITAMIN CH
1.0000 | ORAL_TABLET | Freq: Every day | ORAL | Status: DC
  Administered 2024-10-17 – 2024-10-18 (×2): 1 via ORAL
  Filled 2024-10-16 (×2): qty 1

## 2024-10-16 MED ORDER — DIPHENHYDRAMINE HCL 25 MG PO CAPS: 25.0000 mg | ORAL_CAPSULE | Freq: Four times a day (QID) | ORAL | Status: DC | PRN

## 2024-10-16 MED ORDER — DIBUCAINE (PERIANAL) 1 % EX OINT: 1.0000 | TOPICAL_OINTMENT | CUTANEOUS | Status: DC | PRN

## 2024-10-16 MED ORDER — MENTHOL 3 MG MT LOZG: 1.0000 | LOZENGE | OROMUCOSAL | Status: DC | PRN

## 2024-10-16 MED ORDER — SIMETHICONE 80 MG PO CHEW
80.0000 mg | CHEWABLE_TABLET | Freq: Three times a day (TID) | ORAL | Status: DC
  Administered 2024-10-17 – 2024-10-18 (×2): 80 mg via ORAL
  Filled 2024-10-16 (×2): qty 1

## 2024-10-16 MED ORDER — HYDROMORPHONE HCL 1 MG/ML IJ SOLN: 0.2000 mg | INTRAMUSCULAR | Status: DC | PRN

## 2024-10-16 MED ORDER — LACTATED RINGERS IV SOLN: INTRAVENOUS | Status: DC

## 2024-10-16 MED ORDER — IBUPROFEN 600 MG PO TABS: 600.0000 mg | ORAL_TABLET | Freq: Four times a day (QID) | ORAL | Status: DC

## 2024-10-16 MED ORDER — KETOROLAC TROMETHAMINE 30 MG/ML IJ SOLN: 30.0000 mg | Freq: Four times a day (QID) | INTRAMUSCULAR | Status: DC

## 2024-10-16 NOTE — Lactation Note (Signed)
 This note was copied from a baby's chart. Lactation Consultation Note  Patient Name: Rachel Hanson Unijb'd Date: 10/16/2024 Age:40 hours Reason for consult: Initial assessment;Term  P2. Baby fussy. Baby has all ready BF several times 20 and 25 min each and had 20 ml formula and baby still rooting and suckling on fingers and crying out at intervals. Asked mom if we could put baby to the breast again mom stated yes. LC checked diaper first and was dry.  Baby BF for 25 min. Finally fell asleep. Swallows heard, breast softened some from transfer. Praised mom. Mom has great everted nipples. Mom denies painful latch. Newborn feeding habits, STS, I&O, body alignment and props reviewed. Mom encouraged to feed baby 8-12 times/24 hours and with feeding cues.  Mom very sleepy falling asleep during feeding. LC swaddled baby and placed in bassinet. Praised mom for good feeding.  Maternal Data Has patient been taught Hand Expression?: Yes Does the patient have breastfeeding experience prior to this delivery?: Yes How long did the patient breastfeed?: 7 months to her now 39 yr old  Feeding    LATCH Score Latch: Grasps breast easily, tongue down, lips flanged, rhythmical sucking.  Audible Swallowing: Spontaneous and intermittent  Type of Nipple: Everted at rest and after stimulation  Comfort (Breast/Nipple): Soft / non-tender  Hold (Positioning): Assistance needed to correctly position infant at breast and maintain latch.  LATCH Score: 9   Lactation Tools Discussed/Used    Interventions Interventions: Breast feeding basics reviewed;Assisted with latch;Skin to skin;Breast massage;Hand express;Breast compression;Adjust position;Support pillows;Position options;Education;LC Services brochure  Discharge Discharge Education: Outpatient recommendation  Consult Status Consult Status: Follow-up (mom fell asleep during consult) Date: 10/17/24 Follow-up type: In-patient    Daniela Hernan,  Evin Loiseau G 10/16/2024, 3:44 AM

## 2024-10-16 NOTE — Anesthesia Postprocedure Evaluation (Signed)
"   Anesthesia Post Note  Patient: Rachel Hanson  Procedure(s) Performed: CESAREAN SECTION, WITH BILATERAL TUBAL LIGATION     Patient location during evaluation: L&D Anesthesia Type: Spinal Level of consciousness: oriented and awake and alert Pain management: pain level controlled Vital Signs Assessment: post-procedure vital signs reviewed and stable Respiratory status: spontaneous breathing, respiratory function stable and patient connected to nasal cannula oxygen Cardiovascular status: blood pressure returned to baseline and stable Postop Assessment: no headache, no backache and no apparent nausea or vomiting Anesthetic complications: no   No notable events documented.  Last Vitals:  Vitals:   10/15/24 2328 10/15/24 2345  BP: 102/73 102/74  Pulse:  78  Resp:  20  Temp:  37 C  SpO2:  98%    Last Pain:  Vitals:   10/15/24 2345  TempSrc: Oral  PainSc:    Pain Goal:                   Rome Ade      "

## 2024-10-16 NOTE — Progress Notes (Signed)
 Subjective: Postpartum Day 1: Cesarean Delivery Patient reports tolerating PO.    Objective: Vital signs in last 24 hours: Temp:  [98.1 F (36.7 C)-99.1 F (37.3 C)] 99 F (37.2 C) (01/24 0656) Pulse Rate:  [73-99] 84 (01/24 0656) Resp:  [12-26] 18 (01/24 0656) BP: (81-125)/(52-95) 98/60 (01/24 0656) SpO2:  [95 %-99 %] 99 % (01/24 0656) Weight:  [89.1 kg] 89.1 kg (01/23 1915)  Physical Exam:  General: alert and cooperative Lochia: appropriate Uterine Fundus: firm Incision: healing well DVT Evaluation: No evidence of DVT seen on physical exam.  Recent Labs    10/15/24 0932  HGB 12.6  HCT 38.8    Assessment/Plan: Status post Cesarean section. Doing well postoperatively.  Continue current care.  Kelly Delon Milian, MD 10/16/2024, 8:02 AM

## 2024-10-17 LAB — CBC
HCT: 27.2 % — ABNORMAL LOW (ref 36.0–46.0)
Hemoglobin: 9.1 g/dL — ABNORMAL LOW (ref 12.0–15.0)
MCH: 32.3 pg (ref 26.0–34.0)
MCHC: 33.5 g/dL (ref 30.0–36.0)
MCV: 96.5 fL (ref 80.0–100.0)
Platelets: 180 10*3/uL (ref 150–400)
RBC: 2.82 MIL/uL — ABNORMAL LOW (ref 3.87–5.11)
RDW: 15.1 % (ref 11.5–15.5)
WBC: 9.1 10*3/uL (ref 4.0–10.5)
nRBC: 0 % (ref 0.0–0.2)

## 2024-10-17 MED ORDER — IBUPROFEN 600 MG PO TABS: 600.0000 mg | ORAL_TABLET | Freq: Four times a day (QID) | ORAL | 0 refills | Status: AC | PRN

## 2024-10-17 MED ORDER — DOCUSATE SODIUM 100 MG PO CAPS: 100.0000 mg | ORAL_CAPSULE | Freq: Two times a day (BID) | ORAL | 2 refills | Status: AC

## 2024-10-17 MED ORDER — OXYCODONE HCL 5 MG PO TABS: 5.0000 mg | ORAL_TABLET | ORAL | 0 refills | Status: AC | PRN
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
## 2024-10-17 NOTE — Progress Notes (Signed)
 CSW acknowledges consult and completed clinical assessment. Clinical documentation will follow.  There are no barriers to d/c.  Signed,  Sharyne LOIS Roulette, MSW, LCSWA, LCASA 10/17/2024 12:59 AM

## 2024-10-17 NOTE — Discharge Summary (Signed)
 "    Postpartum Discharge Summary  Date of Service updated 10/17/24     Patient Name: Rachel Hanson DOB: [birth year] MRN: [id number]  Date of admission: 10/15/2024 Delivery date:10/15/2024 Delivering provider: MARNE KELLY NEST Date of discharge: 10/17/2024  Admitting diagnosis: Previous cesarean section [Z98.891] Intrauterine pregnancy: [redacted]w[redacted]d     Secondary diagnosis:  Principal Problem:   Previous cesarean section  Additional problems:  none    Discharge diagnosis: Term Pregnancy Delivered                                              Post partum procedures:None Augmentation: N/A Complications: None  Hospital course: Onset of Labor With Vaginal Delivery      39 y.o. yo G2P2002 at [redacted]w[redacted]d was admitted in Latent Labor on 10/15/2024. Labor course was complicated by none  Membrane Rupture Time/Date: 5:30 PM,10/15/2024  Delivery Method:C-Section, Low Transverse Operative Delivery:N/A Episiotomy: None Lacerations:    Patient had a postpartum course complicated by none.  She is ambulating, tolerating a regular diet, passing flatus, and urinating well. Patient is discharged home in stable condition on 10/17/24.  Newborn Data: Birth date:10/15/2024 Birth time:9:08 PM Gender:Female Living status:Living Apgars:9 ,9  Weight:3459 g  Magnesium Sulfate received: No BMZ received: No Rhophylac:N/A Immunizations administered: Immunization History  Administered Date(s) Administered   Influenza,inj,Quad PF,6+ Mos 08/11/2019   PFIZER(Purple Top)SARS-COV-2 Vaccination 04/25/2020, 05/16/2020   Tdap 06/21/2019, 12/27/2019    Physical exam  Vitals:   10/16/24 1045 10/16/24 1445 10/16/24 2159 10/17/24 0640  BP: (!) 89/54 (!) 104/55 109/73 100/60  Pulse:   93   Resp: 18 18    Temp: 98.9 F (37.2 C) 98.8 F (37.1 C) 98.5 F (36.9 C) 97.9 F (36.6 C)  TempSrc: Oral Oral Oral Axillary  SpO2: 99% 99% 100% 97%  Weight:      Height:       General: alert and cooperative Lochia:  appropriate Uterine Fundus: firm Incision: Healing well with no significant drainage DVT Evaluation: No evidence of DVT seen on physical exam. Labs: Lab Results  Component Value Date   WBC 9.1 10/17/2024   HGB 9.1 (L) 10/17/2024   HCT 27.2 (L) 10/17/2024   MCV 96.5 10/17/2024   PLT 180 10/17/2024      Latest Ref Rng & Units 04/21/2023   12:06 PM  CMP  Glucose 70 - 99 mg/dL 98   BUN 6 - 23 mg/dL 11   Creatinine 9.59 - 1.20 mg/dL 9.24   Sodium 864 - 854 mEq/L 138   Potassium 3.5 - 5.1 mEq/L 4.2   Chloride 96 - 112 mEq/L 104   CO2 19 - 32 mEq/L 26   Calcium 8.4 - 10.5 mg/dL 9.5   Total Protein 6.0 - 8.3 g/dL 6.7   Total Bilirubin 0.2 - 1.2 mg/dL 0.5   Alkaline Phos 39 - 117 U/L 64   AST 0 - 37 U/L 16   ALT 0 - 35 U/L 20    Edinburgh Score:    03/15/2020    6:12 AM  Van Postnatal Depression Scale Screening Tool  I have been able to laugh and see the funny side of things. 0   I have looked forward with enjoyment to things. 0   I have blamed myself unnecessarily when things went wrong. 2   I have been anxious or worried for no good reason. 2  I have felt scared or panicky for no good reason. 1   Things have been getting on top of me. 3   I have been so unhappy that I have had difficulty sleeping. 1   I have felt sad or miserable. 1   I have been so unhappy that I have been crying. 1   The thought of harming myself has occurred to me. 0   Edinburgh Postnatal Depression Scale Total 11      Data saved with a previous flowsheet row definition      After visit meds:  Allergies as of 10/17/2024       Reactions   Sulfamethoxazole  Hives, Swelling, Other (See Comments)   Face redness    Sulfamethoxazole -trimethoprim  Swelling   2 episodes of facial swelling, rash following initiation of Bactrim  2 episodes of facial swelling, rash following initiation of Bactrim . Flu like symptoms   Misc. Sulfonamide Containing Compounds Rash        Medication List     STOP  taking these medications    aspirin EC 81 MG tablet   Diclegis 10-10 MG Tbec Generic drug: Doxylamine-Pyridoxine   valACYclovir 500 MG tablet Commonly known as: VALTREX       TAKE these medications    PRENATAL VITAMINS PLUS PO Take 1 tablet by mouth daily.         Discharge home in stable condition Infant Feeding: Bottle and Breast Infant Disposition:home with mother Discharge instruction: per After Visit Summary and Postpartum booklet. Activity: Advance as tolerated. Pelvic rest for 6 weeks.  Diet: routine diet Anticipated Birth Control: Unsure Postpartum Appointment:6 weeks Additional Postpartum F/U: None Future Appointments:No future appointments. Follow up Visit:      10/17/2024 Kelly Delon Milian, MD   "

## 2024-10-17 NOTE — Progress Notes (Signed)
 Wants abdominal binder post-partum.

## 2024-10-18 DIAGNOSIS — Z98891 History of uterine scar from previous surgery: Secondary | ICD-10-CM

## 2024-10-18 NOTE — Discharge Instructions (Signed)
 Call MD for T>100.4, heavy vaginal bleeding, severe abdominal pain, intractable nausea and/or vomiting, or respiratory distress.  Call office to schedule postpartum visit in 6 weeks.  Pelvic rest x 6 weeks.  No driving while taking narcotics.
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
## 2024-10-19 LAB — SURGICAL PATHOLOGY

## 2024-10-25 ENCOUNTER — Telehealth (HOSPITAL_COMMUNITY): Payer: Self-pay | Admitting: *Deleted

## 2024-10-25 NOTE — Telephone Encounter (Signed)
 10/25/2024  Name: Rachel Hanson MRN: [id number] DOB: 01-16-1986  Reason for Call:  Transition of Care Hospital Discharge Call  Contact Status: Patient Contact Status: Complete  Language assistant needed: Interpreter Mode: Interpreter Not Needed        Follow-Up Questions: Do You Have Any Concerns About Your Health As You Heal From Delivery?: No Do You Have Any Concerns About Your Infants Health?: No  Edinburgh Postnatal Depression Scale:  In the Past 7 Days: I have been able to laugh and see the funny side of things.: As much as I always could I have looked forward with enjoyment to things.: As much as I ever did I have blamed myself unnecessarily when things went wrong.: Not very often I have been anxious or worried for no good reason.: Hardly ever I have felt scared or panicky for no good reason.: No, not much Things have been getting on top of me.: No, most of the time I have coped quite well I have been so unhappy that I have had difficulty sleeping.: Not very often I have felt sad or miserable.: No, not at all I have been so unhappy that I have been crying.: No, never The thought of harming myself has occurred to me.: Never Edinburgh Postnatal Depression Scale Total: 5  PHQ2-9 Depression Scale:     Discharge Follow-up: Edinburgh score requires follow up?: No Patient was advised of the following resources:: Support Group, Breastfeeding Support Group  Post-discharge interventions: Reviewed Newborn Safe Sleep Practices  Mliss Sieve, RN 10/25/2024 11:25
